# Patient Record
Sex: Female | Born: 1965 | Race: Black or African American | Hispanic: No | Marital: Married | Smoking: Never smoker
Health system: Southern US, Community
[De-identification: ages and names within clinical notes are randomized; demographics above are authoritative.]

## PROBLEM LIST (undated history)

## (undated) HISTORY — PX: ABDOMINAL HYSTERECTOMY: SHX81

---

## 1999-06-06 ENCOUNTER — Emergency Department (HOSPITAL_COMMUNITY): Admission: EM | Admit: 1999-06-06 | Discharge: 1999-06-07 | Payer: Self-pay | Admitting: Emergency Medicine

## 1999-11-18 ENCOUNTER — Encounter: Admission: RE | Admit: 1999-11-18 | Discharge: 1999-11-18 | Payer: Self-pay | Admitting: Internal Medicine

## 1999-11-23 ENCOUNTER — Encounter: Admission: RE | Admit: 1999-11-23 | Discharge: 1999-11-23 | Payer: Self-pay | Admitting: *Deleted

## 1999-11-23 ENCOUNTER — Encounter: Payer: Self-pay | Admitting: *Deleted

## 1999-12-01 ENCOUNTER — Encounter: Admission: RE | Admit: 1999-12-01 | Discharge: 1999-12-01 | Payer: Self-pay | Admitting: Hematology and Oncology

## 2000-02-10 ENCOUNTER — Encounter: Admission: RE | Admit: 2000-02-10 | Discharge: 2000-02-10 | Payer: Self-pay | Admitting: Internal Medicine

## 2000-02-28 ENCOUNTER — Encounter: Admission: RE | Admit: 2000-02-28 | Discharge: 2000-02-28 | Payer: Self-pay | Admitting: Obstetrics

## 2000-03-02 ENCOUNTER — Ambulatory Visit (HOSPITAL_COMMUNITY): Admission: RE | Admit: 2000-03-02 | Discharge: 2000-03-02 | Payer: Self-pay

## 2000-03-02 ENCOUNTER — Encounter: Admission: RE | Admit: 2000-03-02 | Discharge: 2000-03-02 | Payer: Self-pay | Admitting: Internal Medicine

## 2000-05-09 ENCOUNTER — Encounter (INDEPENDENT_AMBULATORY_CARE_PROVIDER_SITE_OTHER): Payer: Self-pay | Admitting: Specialist

## 2000-05-09 ENCOUNTER — Inpatient Hospital Stay (HOSPITAL_COMMUNITY): Admission: RE | Admit: 2000-05-09 | Discharge: 2000-05-11 | Payer: Self-pay | Admitting: Gynecology

## 2000-05-31 ENCOUNTER — Emergency Department (HOSPITAL_COMMUNITY): Admission: EM | Admit: 2000-05-31 | Discharge: 2000-05-31 | Payer: Self-pay | Admitting: *Deleted

## 2000-05-31 ENCOUNTER — Emergency Department (HOSPITAL_COMMUNITY): Admission: EM | Admit: 2000-05-31 | Discharge: 2000-06-01 | Payer: Self-pay | Admitting: Emergency Medicine

## 2000-06-01 ENCOUNTER — Inpatient Hospital Stay (HOSPITAL_COMMUNITY): Admission: EM | Admit: 2000-06-01 | Discharge: 2000-06-16 | Payer: Self-pay | Admitting: Gynecology

## 2000-06-01 ENCOUNTER — Encounter (INDEPENDENT_AMBULATORY_CARE_PROVIDER_SITE_OTHER): Payer: Self-pay

## 2000-06-01 ENCOUNTER — Encounter: Payer: Self-pay | Admitting: Emergency Medicine

## 2000-06-01 ENCOUNTER — Encounter (INDEPENDENT_AMBULATORY_CARE_PROVIDER_SITE_OTHER): Payer: Self-pay | Admitting: Specialist

## 2000-06-02 ENCOUNTER — Encounter: Payer: Self-pay | Admitting: Gynecology

## 2000-06-04 ENCOUNTER — Encounter: Payer: Self-pay | Admitting: Surgery

## 2000-06-05 ENCOUNTER — Encounter: Payer: Self-pay | Admitting: Surgery

## 2000-06-06 ENCOUNTER — Encounter: Payer: Self-pay | Admitting: Surgery

## 2000-06-07 ENCOUNTER — Encounter: Payer: Self-pay | Admitting: Gynecology

## 2000-06-08 ENCOUNTER — Encounter: Payer: Self-pay | Admitting: Internal Medicine

## 2000-06-10 ENCOUNTER — Encounter: Payer: Self-pay | Admitting: Internal Medicine

## 2000-06-16 ENCOUNTER — Encounter: Payer: Self-pay | Admitting: Surgery

## 2001-11-11 ENCOUNTER — Ambulatory Visit (HOSPITAL_COMMUNITY): Admission: RE | Admit: 2001-11-11 | Discharge: 2001-11-11 | Payer: Self-pay | Admitting: Obstetrics

## 2001-11-11 ENCOUNTER — Encounter: Payer: Self-pay | Admitting: Obstetrics

## 2002-07-21 ENCOUNTER — Emergency Department (HOSPITAL_COMMUNITY): Admission: EM | Admit: 2002-07-21 | Discharge: 2002-07-22 | Payer: Self-pay | Admitting: Emergency Medicine

## 2002-07-22 ENCOUNTER — Encounter: Payer: Self-pay | Admitting: Emergency Medicine

## 2004-09-28 ENCOUNTER — Ambulatory Visit (HOSPITAL_COMMUNITY): Admission: RE | Admit: 2004-09-28 | Discharge: 2004-09-28 | Payer: Self-pay | Admitting: Obstetrics

## 2006-02-23 ENCOUNTER — Emergency Department (HOSPITAL_COMMUNITY): Admission: EM | Admit: 2006-02-23 | Discharge: 2006-02-23 | Payer: Self-pay | Admitting: Family Medicine

## 2006-03-02 ENCOUNTER — Emergency Department (HOSPITAL_COMMUNITY): Admission: EM | Admit: 2006-03-02 | Discharge: 2006-03-02 | Payer: Self-pay | Admitting: Family Medicine

## 2007-11-22 ENCOUNTER — Emergency Department (HOSPITAL_COMMUNITY): Admission: EM | Admit: 2007-11-22 | Discharge: 2007-11-22 | Payer: Self-pay | Admitting: Emergency Medicine

## 2008-11-16 ENCOUNTER — Emergency Department (HOSPITAL_COMMUNITY): Admission: EM | Admit: 2008-11-16 | Discharge: 2008-11-16 | Payer: Self-pay | Admitting: Family Medicine

## 2009-07-30 ENCOUNTER — Ambulatory Visit (HOSPITAL_COMMUNITY): Admission: RE | Admit: 2009-07-30 | Discharge: 2009-07-30 | Payer: Self-pay | Admitting: Family Medicine

## 2009-08-12 ENCOUNTER — Encounter: Admission: RE | Admit: 2009-08-12 | Discharge: 2009-08-12 | Payer: Self-pay | Admitting: Family Medicine

## 2011-01-06 NOTE — H&P (Signed)
[facility]  Patient:    Casey Wright, Casey Wright          MRN: [id number] Adm. Date:  [id number] Attending:  Katrina Stack                         History and Physical  CHIEF COMPLAINT:  Nausea and abdominal pain.  HISTORY OF PRESENT ILLNESS:  Casey Wright is a 45 year old white nulligravida who, on May 09, 2000, had exploratory laparotomy, lysis of extensive pelvic adhesions, myomectomy of an enormous posterior wall myoma with reconstruction of her uterus, lysis of extreme pelvic adhesions with excision of endometriosis, excision of bilateral ovarian endometriomas and finally right salpingo-oophorectomy with reconstruction of the left tube and ovary. Postoperatively, she did well and was discharged home.  She was seen frequently in the office postoperatively and had no difficulties until approximately three weeks postoperatively and she began to have abdominal pain.  She was seen in the office and examined and was without evidence of fever, direct or rebound tenderness.  She was given Ultram and asked to return for further followup if her symptoms did not improve.  She presented to the emergency room rather than the office and was examined there on two occasions. She complained of not having had a bowel movement for approximately 48 hours and was given magnesium citrate.  After taking magnesium citrate, she became acutely more nauseated and experienced vomiting and increasingly severe abdominal pain.  She was seen again at the office on June 01, 2000 and noted to have very diminished bowel sounds, a distended abdomen and direct abdominal tenderness.  Examination also revealed a foul-smelling vaginal discharge.  She related a history of onset of menstrual period approximately three days prior to recurrence to the onset of her abdominal pain with progressive increase in discomfort.  She is now readmitted with suspected endomyometritis and  possible recurrence of tuboovarian complex.  PAST MEDICAL HISTORY AND REVIEW OF SYSTEMS:  As recorded in the previous history and physical examination.  PHYSICAL EXAMINATION  GENERAL:  Well-developed, thin, acutely ill black female.  HEENT:  Pupils equal, react to light.  Fundi not examined.  Oropharynx clear with ketotic breath.  NECK:  Supple without mass or thyroid enlargement.  CHEST:  Clear P to A.  HEART:  Regular rhythm, without murmur or cardiac enlargement.  ABDOMEN:  Distended with well-healed vertical midline skin incision from recent surgery, as above.  Examination of her abdomen revealed diminished breath sounds with direct tenderness in all quadrants and no significant rebound.  No masses.  PELVIC:  External genitalia:  Normal female.  Vagina:  Foul-smelling, bloody mucoid vaginal discharge.  Cervix is open and moderately tender to examination.  The uterus was noted to be approximately 10-weeks-size.  No other masses could be palpated but her ability to cooperate was quite limited. Ultrasound examination revealed a long uterine cavity with a broadened endometrial stripe, no significant fluid in the cavity.  There was a large pool of peritoneal fluid in the cul-de-sac.  Aspiration of the cul-de-sac fluid was done after adequate preparation of the vagina and cul-de-sac under ultrasound directions, submitted for cytologic exam, Gram stain and for cultures.  The fluid was yellowish-amber color, slightly turbid.  Gram stain revealed no evidence of bacteria.  Cultures are submitted, aerobic only.  EXTREMITIES:  Negative.  No evidence of phlebitis.  NEUROLOGIC:  Physiologic.  IMPRESSIONS 1. 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 2. Ileus versus possible  small-bowel obstruction.  NOTE:  She had recent cultures for GC and Chlamydia which were negative preoperatively. DD:  06/04/00 TD:  06/05/00 Job: [id number]

## 2011-01-06 NOTE — Op Note (Signed)
[facility]  Patient:    Casey Wright, Casey Wright                     MRN: [id number] Attending:  Gretta Cool, M.D.                           Operative Report  PREOPERATIVE DIAGNOSES 1. Enormous uterine leiomyomata, posterior wall of the uterus, with abnormal    bleeding. 2. Bilateral solid ovarian masses suspicious of endometriomas. 3. Incapacitating cyclic pelvic pain, probably secondary to endometriosis. 4. CA125 elevation to 203.  POSTOPERATIVE DIAGNOSES 1. Bilateral ovarian endometriomas. 2. Severe pelvic inflammatory disease with extensive adhesion in bilateral    tuboovarian complexes. 3. Large posterior leiomyoma with abnormal uterine bleeding.  PROCEDURE:  Exploratory laparotomy, lysis of extensive pelvic adhesions, myomectomy with reconstruction of the uterus, lysis of extreme pelvic adhesions, excision of endometriosis, excision of bilateral endometriomas, right salpingo-oophorectomy, uterine suspension to the right round ligament.  SURGEON:  Gretta Cool, M.D.  ASSISTANT:  Raynald Kemp, M.D.  ANESTHESIA:  General endotracheal.  DESCRIPTION OF PROCEDURE:  Under excellent general anesthesia, with the patients abdomen prepped and draped into a sterile field, in Allen stirrups, a vertical skin incision was made from the umbilicus to the pubic symphysis. The fascia was then opened and the peritoneum incised.  The abdomen was then explored.  There were no abnormalities in the upper abdomen.  Examination of the pelvis revealed an enormous leiomyoma protruding from the posterior wall of the uterus; there were also bilateral endometriomas of both ovaries, the left approximately 7 cm, the right 5 cm.  Extensive pelvic inflammatory disease was also present with bilateral tuboovarian complexes.  There was severe adhesion of the right ovary to the posterior aspect of the uterus and to the lateral pelvic wall.  Because of the adjacent masses,  visualization was difficult throughout the procedure.  The uterine fibroid was managed by incision of the posterior wall of the uterus in an elliptical incision.  The myoma was then dissected by blunt and sharp dissection until the entire fibroid had been removed.  It extended all the way to the endometrial cavity, with evidence of great distention of the endometrial cavity.  There was no palpable mass in the cavity other than this fibroid.  Once the fibroid was excised and bleeding points ligated or cauterized along the way, the uterus was reconstructed in layers, using 0 Vicryl for the first two layers of muscle closure.  Once bleeding was well-controlled, the serosa was trimmed and a subcuticular closure used to close the serosa of the posterior aspect of the uterus to prevent adhesion formation.  Once the uterus was repaired, attention was turned first to the left ovary.  It was elevated from the pelvic by blunt and sharp dissection.  The fallopian tube was dissected off of the ovary at the fimbriated end.  The ovary was then opened and the enormous endometrioma evacuated.  Next, the endometrioma cyst lining was stripped and the entire endometriosis removed from the ovary.  Note:  There was virtually no surface disease of endometriosis present.  The ovary was then reconstructed with a deep suture and then subcuticular closure of 4-0 PDS.  A second endometrioma was then identified on the right ovary and it was also incised, the cyst wall evacuated and then it was closed in layers with subcuticular closure as well. Examination of the right ovary again confirmed a very  large endometrioma and severe adhesion to the posterior aspect of the uterus and to the lateral pelvic wall.  Because of the hydrosalpinx present and the severe adhesion of the ovary, with great difficulty in mobilizing it from the pelvis, decision was made to proceed with right salpingo-oophorectomy.   Infundibulopelvic vessels were skeletonized, clamped, transected and then doubly ligated with 0 Vicryl.  The ovarian ligaments were then skeletonized, clamped, cut, sutured and tied with 0 Vicryl as well.  The specimen was submitted for pathologic exam.  At this point, the right round ligament was used to elevate the uterus and pull the posterior incision away from the left ovary.  The round ligament was pulled back over the infundibulopelvic and ovarian defect so as to rotate the uterus away from the ovary and elevate the uterus into higher intra-abdominal position rather than returning it to the pelvis.  At this point, all of the pelvic surfaces were examined; there was no evidence of significant bleeding remaining.  Peritoneal bleeding points and all bleeding points that were identified were treated by cautery or individual ligation. The ______ was then returned to the cul-de-sac and the omentum pulled down over the pelvic structures so as to help prevent adhesion formation.  The abdominal peritoneum was then closed with running suture of 0 Monocryl.  The fascia was then approximated with a running suture of 0 Vicryl, far and near, from each angle to the midline.  Subcutaneous tissue was approximated with interrupted sutures of 3-0 Vicryl and the skin was closed with skin staples and Steri-Strips.  At the end of the procedure, sponge and lap counts were correct.  There were no complications.  Patient returned to the recovery room in excellent condition.DD:  05/09/00 TD:  05/09/00 Job: [id number]

## 2011-01-06 NOTE — H&P (Signed)
[facility]  Patient:    Casey Wright, Casey Wright                     MRN: [id number] Adm. Date:  05/09/00 Attending:  Gretta Cool, M.D.                         History and Physical  CHIEF COMPLAINT:  Pelvic pain.  HISTORY OF PRESENT ILLNESS:  Ms. Schoff is a 45 year old nulligravida, recently married to a man 11 years her senior, complaining of anemia, painful intercourse and bulge of her abdomen.  She states over the last several months, she has begun to be uncomfortable, sufficient to prevent penetration with intercourse; she has also developed a bulge above her symphysis, extending nearly to the umbilicus.  She has had diminished bladder capacity; has difficulty making it through the night without getting up to void several times.  She is also having extremely heavy flow for the first two days of her cycle, with five to seven days total flow.  She has developed significant anemia.  She has continued to have regular cyclic menses but is concerned because of her difficulty with discomfort and with achieving intercourse.  She has a history of Chlamydia PID.  She has never used effective contraception and has not conceived.  On ultrasound examination, she was found to have an enormous abdominopelvic mass with uterine leiomyoma identified measuring 8.1 x 7.2 cm in maximal dimension; she also was noted to have bilateral ovarian masses, solid in appearance, with homogeneous appearances characteristic of endometriomas, bilaterally.  Her left ovary measured 7.6 x 6.4 x 7.1 cm; the right ovary measured 7.0 x 4.6 x 5.4 cm.  She had CA125 drawn because of suspicion of endometriosis versus solid ovarian tumor; CA125 was 203.  Because of the concern over the possibility of solid ovarian tumors, she is scheduled with backup of Dr. Reuel Boom L. Clarke-Pearson, GYN-Oncology, [employer], in case there is more than benign disease.  She is now admitted for definitive  therapy, with hopes of preserving her fertility, for myomectomy and for treatment of probable benign endometriomas of both ovaries and probable sequelae of previously known pelvic inflammatory disease.  She strongly wishes to preserve fertility.  She understands the possibility of need for total hysterectomy and salpingo-oophorectomy.  PAST MEDICAL HISTORY:  Usual childhood diseases without sequelae.  Denies previous hospitalizations.  ALLERGIES:  None known.  PRESENT MEDICATIONS:  None.  FAMILY HISTORY:  Father is living and well.  Mother is living with type 2 diabetes.  Two brothers and two sisters alive and well.  SOCIAL HISTORY:  Recently married to a man 11 years her senior.  REVIEW OF SYSTEMS:  HEENT:  Denies symptoms.  CARDIORESPIRATORY:  Denies asthma, cough, bronchitis, shortness of breath.  GI/GU:  Denies frequency, urgency or dysuria.  She does have nocturia and diminished bladder capacity.  PHYSICAL EXAMINATION  GENERAL:  A well-developed, thin black female.  HEENT:  PERLA.  Fundi not examined.  Oropharynx clear.  NECK:  Supple without mass or thyroid enlargement.  CHEST:  Clear P-to-A.  HEART:  Regular rhythm.  Without murmur or cardiac enlargement.  BREASTS:  Soft, without mass, nodes or nipple discharge.  ABDOMEN:  A large protuberant mass is noted arising from beneath the symphysis to just below the umbilicus.  The mass is firm and tender.  There is no other organomegaly.  PELVIC:  External genitalia:  Normal female.  Vagina clean,  rugose.  The vagina can only be penetrated for 4 or 5 cm before encountering a tender pelvic mass.  There are bilateral soft masses adjacent to the cervix that are compatible with the ultrasound findings of bilateral endometriomas.  There is a large uterine mass approximately 16-weeks-size arising nearly to the umbilicus and fills the pelvis, assumed to be a posterior myoma, compatible with the ultrasound  findings.  EXTREMITIES:  Negative.  NEUROLOGIC:  Physiologic.  IMPRESSIONS 1. Uterine leiomyoma, largely one solitary myoma, posterior wall of the    uterus, with abnormal uterine bleeding and severe anemia. 2. Bilateral ovarian masses, most likely endometriomas; rule out solid ovarian    tumors. 3. History of pelvic inflammatory disease with Chlamydia.  DISPOSITION AND PLAN:  Exploratory laparotomy and definitive therapy as indicated, with the goal of preserving her fertility if possible and correcting the underlying defects.  She understands the possibility of other than benign disease and possible definitive therapy including total abdominal hysterectomy, salpingo-oophorectomy, etc.  She understands options and alternatives and the possibility of subsequent need for further surgery if her pelvic organs are retained with these findings. DD:  05/10/00 TD:  05/11/00 Job: [id number]

## 2011-01-06 NOTE — Op Note (Signed)
[facility]  Patient:    Casey Wright, Casey Wright          MRN: [id number] Proc. Date: 06/05/00 Adm. Date:  [id number] Attending:  Katrina Stack                           Operative Report  PREOPERATIVE DIAGNOSIS:  Small bowel obstruction.  POSTOPERATIVE DIAGNOSIS:  Small bowel obstruction.  PROCEDURE:  Exploratory laparotomy, lysis of adhesions.  SURGEON:  Abigail Miyamoto, M.D.  ASSISTANT:  Gretta Cool, M.D.  ANESTHESIA:  General endotracheal anesthesia.  ESTIMATED BLOOD LOSS:  Minimal.  INDICATIONS:  The patient is a 45 year old female who had undergone exploration and removal of right ovary and tube for severe PID, tubo-ovarian abscess, and removal of myomas of the uterus.  She now presents with a large amount of fluid in the pelvis and dilated loops of bowel consistent with either an ileus or a bowel obstruction.  FINDINGS:  The patient was found to have small bowel obstruction with loops of bowel tethered to the posterior aspect of the uterus.  Once the bowel was freed up, the obstruction appeared to open up and no resection was required.  DESCRIPTION OF PROCEDURE:  The patient was brought to the operating room and identified as [employer].  She was placed upon the operating table and anesthesia was induced.  Her abdomen was then prepped and draped in the usual sterile fashion.  Her previous midline incision was then opened with a scalpel.  The incision was carried down through the fascia with the cautery. The peritoneum was then identified and opened the entire length of the incision.  Her incision was just off to the left side of the midline.  Upon entering the abdomen, a large amount of clear ascitic-appearing fluid was identified and suctioned.  The patient was found to have multiple dilated loops of small bowel with collapse of the distal bowel.  She was also found to have multiple loops tethered to the posterior  aspect of the uterus and to the sigmoid colon.  The adhesions were taken down with Metzenbaum scissors.  Once all adhesions were taken down and the bowel was freed off the uterus, it was examined.  No enterotomies were identified.  Some areas of serosal tearing were closed with interrupted 3-0 silk sutures.  The bowel was then run from the terminal ileum to the ligament of Treitz, and again no other injury was identified.  The bowel contents were milked back into the stomach and sucked out of the NG tube.  Next, Dr. Nicholas Lose will dictate his part of the hysterectomy.  Once this was done and the abdomen irrigated, the peritoneum was closed with a running 0 Monocryl suture.  The fascia was then closed with a running #1 Novofil suture.  Dr. Nicholas Lose will dictate also the rest of the closure. DD:  06/05/00 TD:  06/06/00 Job: [id number]

## 2011-01-06 NOTE — Discharge Summary (Signed)
[facility]  Patient:    Casey Wright, Casey Wright          MRN: [id number] Adm. Date:  [id number] Disc. Date: [id number] Attending:  Katrina Stack CC:         Gretta Cool, M.D. (2)   Discharge Summary  HISTORY OF PRESENT ILLNESS:  This is a 45 year old black married nulligravida, status post exploratory laparotomy, lysis of extensive pelvic adhesions, myomectomy of enormous posterior wall myoma, with reconstruction of uterus, lysis of extreme pelvic adhesions, and excision of endometriosis, excision of bilateral ovarian endometriomas, and finally right salpingo-oophorectomy, with reconstruction of he left tube and ovary on May 09, 2000.  Postoperatively she did well and was discharged home on the second postoperative day.  She was seen frequently in the office and did well postoperatively until approximately three weeks after surgery. She then began to have abdominal pain.  She was seen and examined in the office, without evidence of fever, direct, or rebound tenderness.  She was given Ultram, and asked to return for followup.  She presented to the emergency room rather than to my office, and was examined there and treated on two occasions.  She complained of not having had a bowel movement for 48 hours prior to admission to the emergency room, and was given magnesium citrate.  She became acutely more nauseated and experienced vomiting, increasingly severe abdominal pain, and was again seen in the office on June 01, 2000.  She was noted to have diminished bowel sounds and  distended abdomen with direct abdominal tenderness.   A pelvic examination also  revealed a foul-smelling vaginal discharge.  She related a history of onset of er pain and discomfort to approximately three days after the onset of menses.  Her  abdominal pain became progressively worse, and her nausea and vomiting also worsened.  She is now admitted  with a diagnosis of endometritis with probable recurrence of tubo-ovarian complex and pelvic inflammatory disease, with concomitant severe ileus, rule out small bowel obstruction.  PAST MEDICAL HISTORY/REVIEW OF SYSTEMS:  See the recent admission history and physical.  PHYSICAL EXAMINATION:  GENERAL:  A generally well-developed, but acutely ill black female.  General examination is entirely unremarkable with the exception of that related to her abdomen and pelvis.  ABDOMEN:  Distended.  There is a well-healed vertical midline incision from her  previous recent surgery.  On examination of her abdomen she had diminished bowel sounds.  Direct tenderness in all quadrants.  No significant rebound.  PELVIC:  Her external genitalia normal.  Vagina was filled with a foul-smelling  bloody mucoid discharge.  Cervix is open, moderately tender to examination. Uterus is noted to be approximately 10 weeks size.  There are no other masses palpable, but the examination is exceedingly limited, because of her guarding and tenderness, and intolerance to examination.  On ultrasound examination in a longitudinal view of the endometrial cavity, there is a broadened endometrial stripe.  No significant amount of fluid in the cavity. There is a large pool of peritoneal fluid present in the cul-de-sac. Aspiration of the cul-de-sac was done after adequate preparation of the vagina, and the fluid  submitted for cytologic examination, Grams stain, and cultures.  The fluid was yellowish, amber, slightly turbid.  Grams stain revealed no bacteria.  Cultures  were submitted.  IMPRESSION ON ADMISSION: 1. 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    Probable progressive endometritis following onset of menses. 2. Ileus, versus  possible small bowel obstruction.  LABORATORY DATA:  Revealed a white count of 9100, hemoglobin 8.6, hematocrit 27.6. Normal differential.  There was an increase in the number of bands, toxic granulation, vacuolated neutrophils, polychromasia, schistocytes.  Her sedimentation rate was elevated at 48.  Her electrolytes remained reasonably normal with a potassium of 3.6, sodium 137.   HOSPITAL COURSE:  She was initially treated conservatively for ileus, versus small bowel obstruction, with antibiotics and IV hydration.  She reported a spontaneous bowel movement while in x-ray, for a flat and upright of her abdomen studies. n June 02, 2000, she reported several more bowel movements, and passage of flatus. X-rays confirmed a mild ileus.  It was anticipated that she may be beginning to  resolve the endometritis and ileus on antibiotic therapy and conservative management.  Further conservative management failed to improve her condition, however, and a nasogastric tube was placed.  Again she failed to respond to this conservative management.  A surgical consultation was requested.  They also had the same impression of ileus, versus small bowel obstruction.  She was hypokalemic nd the potassium and electrolyte balance corrected vigorously.  On June 05, 2000, she experienced a temperature spike to 101 degrees.  She continued to have an extremely distended abdomen, considered to be becoming even more distended, in spite of nasogastric suction and antibiotic therapy.  Her care was reviewed with the general surgeons on June 05, 2000, because of our concerns of her lack of response to IV antibiotic therapy and conservative management of an ileus, versus small bowel obstruction, the mounting risk of complications such s adult RDS, thromboembolic complications, etc.  A decision was made to proceed to re-exploration and definitive therapy as indicated.  On re-exploration, she  was noted to have a large volume of peritoneal fluid that had previously been noted on x-ray.  Her bowel was grossly distended.  She had  small bowel loops present in the pelvis, adherent to the posterior aspect of the uterus, and to the ovarian repair of the left ovary and fallopian tube.  The fallopian tube and ovary had again become a tubo-ovarian complex, with extensive adhesion to pelvic structures, and to bowel.  The bowel was removed from the pelvis and repaired by the attending general surgeon, Dr. Abigail Miyamoto.  Because of the evidence of extreme pelvic inflammatory response once again, and because of now virtual complete destruction of her right ovary and fallopian tube with the inflammatory process, and also because of the extremely poor probability of successful pregnancy, it was the opinion of the operators, Dr. Gretta Cool and Dr. Raynald Kemp, that she had virtually no change of a successful pregnancy, nd that she had a very high chance of recurrence of bowel adhesion and reobstruction or continuing infection that may threaten her life.  The decision was therefore  made to proceed to a supracervical hysterectomy, and to remove the source of possible continuing infection and reinfection with each menstrual period.  At this point she underwent a supracervical hysterectomy.  She was transfused intraoperatively.  She also had a great excess of IV fluid administered intraoperatively over her output.  She continued to have a marked tachycardia on June 06, 2000, and was transfused again to a more physiologic hemoglobin and  hematocrit.  Her hemoglobin stabilized at 10.1.  A PIC line was placed by the general surgeons and x-ray personnel  for TPN.  Her electrolyte abnormalities corrected, and nutritional therapy was begun.  It had been more than one week since she had had effective nutrition.  Shortly after the [facility] line was placed, and TPN begun, the patient  had an episode of desaturation from the 90s to 76 PO2.  She ad subsequently undergone a workup for a suspected pulmonary embolus with a V/Q scan. She also had a venous Doppler study of her PIC site.  She was initially anticoagulated, but because of a concern for acute bleeding because of the recent surgery, and with no evidence of thromboembolic disease by any of the studies, he decision was made to discontinue anticoagulant therapy.  She was temporarily kept on subanticoagulating doses, but that was also eventually discontinued. Because of a depressed mental state and difficulty with cooperation with the nurses, and a  psychosis even with visual hallucinations in the step-down unit, medication was  given to improve her mental status.  She was given Ativan initially with effective sleep.  She resolved the psychosis portion.  She remained depressed, and subsequently was begun on Zoloft.  She continued to have an extremely marked tachycardia on June 09, 2000.  Her  hemoglobin remained quite low because of the persistent tachycardia and concern  over cardiovascular decompensation.  She was transfused 3 units of packed cells, to a more physiologic hemoglobin again.  She responded with improvement in her tachycardia.  She continued to have a low-grade fever that gradually decreased o normal over the next 48 hours.  She remained very severely depressed and had difficulty cooperating with the nursing staff and remained very discouraged about slow progress of decompression of her abdomen and return to normal bowel function. Remained concerned about eating, because of the possibility of recurrence of pain and possible reobstruction.  She also complained of hot flushes, and her initial estrogen patch was increased from 0.05 to 0.1 mg dose.  By June 13, 2000, her TPN was being weaned, and she was encouraged to take small amounts of liquids. She remained concerned about eating  and possible redistended and bowel complications. Eventually she agreed to take liquid nutrition such as Resource, milk shakes, etc. Her diet was eventually advanced.  On June 15, 2000, she was passing flatus but remained uncooperative with oral intake.  Her PIC line remained in place for delivery of IV fluids.  With the help of pastoral care, and with antidepressant therapy, she subsequently agreed to progress her nutrition and diet.  DISPOSITION: Her PIC line was removed, and she discharged home on June 16, 2000, to continue to progress her diet.  DISCHARGE MEDICATIONS: 1. Zoloft 25 mg, increasing to 50 mg q.d. 2. Xanax 0.5 mg 1/2 tablet to one tablet b.i.d. for anxiety. 3. Citrucel tablet t.i.d. 4. Darvocet-N 100 mg p.r.n. pain. 5. Multivitamins q.d. 6. Estradiol 17 beta patch 0.1 mg dose.  INSTRUCTIONS:  I have continued to explain the need for adequate ambulation to elp prevent adhesion formation and for careful daily management of her bowel movements, and daily use of Metamucil to help prevent reobstruction and the risk of a partial small bowel obstruction recurring.  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  PROCEDURE: 1. Exploratory laparotomy, supracervical hysterectomy, left salpingo-oophorectomy,    release of small bowel adhesions from the pelvis. 2. Peripherally inserted catheter line placement. 3. Peripheral nutrition (TNA).  OTHER SIGNIFICANT EVENTS:  Suspected pulmonary embolus, with no evidence of embolus on the V/Q scan  and Doppler studies.  CONSULTATION:  General surgery and pharmacy for TNA. DD:  07/10/00 TD:  07/10/00 Job: [id number]

## 2011-01-06 NOTE — Discharge Summary (Signed)
[facility]  Patient:    Casey Wright, Casey Wright          MRN: [id number] Adm. Date:  [id number] Disc. Date: [id number] Attending:  Katrina Stack                           Discharge Summary  CHIEF COMPLAINT:  Pelvic pain.  BRIEF HISTORY:  Ms. Baity is a 45 year old black recently married, nulligravida, married to a man 11 years her senior.  She complains of painful intercourse and a bulge of her abdomen.  She states over the last several months she has begun to be uncomfortable sufficiently to prevent penetration with intercourse.  She has also developed a huge bulge in her abdomen and above the symphysis extending nearly to the umbilicus.  She has noticed diminished bladder capacity, difficulty making it through the night without up to void several times.  She is also having extremely heavy menstrual flow for the 2 days of each cycle, cycles now lasting 5-7 days total.  She has developed significant anemia.  She is now concerned because of the pain and anemia.  She has a previous history of pelvic inflammatory disease with chlamydia.  She has never used effective contraception and has not conceived even though she has had multiple partners.  On ultrasound examination in the office she was found to have an enormous abdominal pelvic mass with uterine leiomyomata identified measuring 8.1 x 7.2 cm in maximum dimension.  She is also noted to have bilateral ovarian masses, solid in appearance with homogenous characteristics suggesting endometriomas bilaterally.  Her left ovary measured 7.6 x 6.4 x 7.1.  The right ovary measured 7.0 x 4.6 x 5.4.  She had CA-125 drawn for preoperative evaluation because of suspicion of endometriosis versus solid ovarian tumor.  CA-125 was 203.  Because of the possibility of solid ovarian tumor Dr. De Blanch was scheduled to stand by the primary surgery.  She was admitted for definitive therapy in hopes of  preserving her fertility, for myomectomy and for treatment of her bilateral pelvic masses assumed to be endometriosis.  She also understands the possibility that she may require hysterectomy if this is other than benign disease or nontreatable by conservative surgery.  PAST MEDICAL HISTORY AND REVIEW OF SYSTEMS:  Noncontributory.  PHYSICAL EXAMINATION:  Largely as described above.  GENERAL:  Her general exam is unremarkable.  Exam related to her abdomen and pelvis reveals a 16 cm uterine fundus rising to nearly the umbilicus.  The pelvis is full of mass including the fibroid and the abdominal pelvic mass. On pelvic examination she has exquisite tenderness with bilateral adnexal masses palpable and large posterior uterine leiomyoma.  IMPRESSION: 1. Uterine leiomyomata, largely solitary, posterior wall with abnormal uterine    bleeding and severe anemia. 2. Bilateral ovarian masses most likely endometriomas, rule out ovarian    neoplasm, solid. 3. 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  On admission the plan was exploratory laparotomy and definitive therapy as indicated by the pathology found.  As surgery she underwent exploratory laparotomy, lysis of extensive pelvic adhesions, myomectomy with reconstruction of the uterus, right salpingo-oophorectomy for unreconstructable severe pelvic inflammatory process and severe endometriosis, left ovarian resection of endometrioma with repair of the ovary and fallopian tube.  Note, she had bilateral tube ovarian complexes.  The right was so scarred that it was felt not compatible with reconstruction.  The left was less  involved and the ovarian surface was not extensively damaged and it was elected to preserve the left ovary after myomectomy and reconstruction of the left fallopian tube and relief of the hydrosalpinx.  Postoperatively the patient was very difficult to mobilize.  She  remained afebrile and was taking regular diet and finally discharged home on May 11, 2000 after passing bowel movement and flatus.  She is to return to the office in 1 week, report a temperature of greater than 100.5, report failure of daily improvement, Vioxx 25 mg daily for pain, ambulate every 2-3 hours to help prevent adhesion reformation.  She understands the importance of ambulation to prevent adhesion reformation and to allow maximum benefit of the surgery that has been done.  Her postoperative hemoglobin was 7.4, and hematocrit 23.5, preoperative 10.8, and 33.3. DD:  07/10/00 TD:  07/10/00 Job: [id number]

## 2011-01-06 NOTE — Op Note (Signed)
[facility]  Patient:    Casey Wright, Casey Wright          MRN: [id number] Proc. Date: 06/05/00 Adm. Date:  [id number] Attending:  Katrina Stack CC:         Abigail Miyamoto, M.D.   Operative Report  PREOPERATIVE DIAGNOSIS:  Endomyometritis with probable tubo-ovarian complex recurrent to ileus versus small bowel obstruction.  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  PROCEDURES: 1. Exploratory laparotomy. 2. Decompression of bowel. 3. Lysis of small and large bowel adhesions. 4. Lysis of extensive pelvic adhesions. 5. Left salpingo-oophorectomy and abdominal hysterectomy.  SURGEON:  Gretta Cool, M.D.  ASSISTANT:  Raynald Kemp, M.D. and general surgeon Abigail Miyamoto, M.D. for the general surgical portion of the procedure.  His operative noted will be dictated separately.  DESCRIPTION OF PROCEDURE:  Under excellent general anesthesia with the patient prepped and draped in the lithotomy position, a vertical skin incision was made from the umbilicus to the pubic symphysis at the site of her previous incision.  The incision was then subsequently extended beyond the umbilicus to allow adequate room and exposure.  Dr. Magnus Ivan released the small bowel adhesions, several loops from the pelvis adherent to the posterior aspect of the uterus and to the left ovary.  Serosal defects were repaired by over sewing the defects by Dr. Magnus Ivan.  The small bowel was then decompressed. At her previous surgery, the patient had right salpingo-oophorectomy for bilateral tubo-ovarian complexes.  She had repair and fimbriolysis of the hydrosalpinx on the left and excision of an enormous endometrioma.  She also had excision of an enormous posterior wall myoma occupying virtually the posterior half of the uterus.  There was adhesion of small bowel to  the structures noted above and adhesion of the large bowel to the posterior aspect of the uterus.  Once the bowel was decompressed by evacuation through nasogastric tube, the bowel appeared viable, healthy and without the need for resection in the opinion of Dr. Magnus Ivan.  Attention was then turned to the removal of the inflamed organs.  The infundibulopelvic vessels were isolated from the ureter, clamped, cut, sutured and tied with 0 Vicryl.  The round ligament was then also clamped, cut, sutured and tied bilateral with 0 Vicryl. The anterior leaf of the broad ligament was then opened and the bladder pushed off of the lower segment.  The uterine vessels were then skeletonized, clamped, cut, sutured and tied with 0 Vicryl.  The cervix was then incised and a supracervical hysterectomy contemplated.  The cervix was wedged until the entire endocervical canal was delivered by traction and the entire cervix eventually removed.  The thick paracervical fascia and vaginal cuff was then sutured with a running suture of 0 Vicryl from each angle to the midline.  A second suture was used to invert the initial suture line with with 2-0 Vicryl. At this point, all bleeding was well controlled.  Other bleeding sites were controlled by monopolar cautery.  The bowel was again examined and there was no evidence of compromise of the bowel integrity or vascularity.  At this point, the abdominal peritoneum was closed with a running suture of 0 Vicryl from the apex to the symphysis.  The abdominal wall was then closed by Dr. Magnus Ivan with a running Smead-Jones type closure of 0 Novofil.  The subcutaneous tissue and skin was then closed with interrupted mattress sutures of 3-0 nylon.  Steri-Strips were  then applied and the patient returned to the recovery room in excellent condition.  She tolerated the procedure well. Preoperatively, she was transfused one unit.  Intraoperatively, she was transfused a second. DD:   06/05/00 TD:  06/06/00 Job: [id number]

## 2011-01-06 NOTE — Consult Note (Signed)
[facility]  Patient:    Casey Wright, Casey Wright          MRN: [id number] Proc. Date: 06/04/00 Adm. Date:  [id number] Attending:  Katrina Stack CC:         Gretta Cool, M.D.   Consultation Report  ACCOUNT NUMBER:  [id number].  REASON FOR CONSULTATION:  Possible bowel obstruction versus postoperative ileus.  CLINICAL HISTORY:  I was asked today to see Casey Wright for a possible ileus versus postoperative bowel obstruction.  She is a 45 year old lady who underwent removal of enormous uterine leiomyomata with bilateral solid ovarian masses suspicious of endometriomas on approximately May 09, 2000. Patient tolerated the procedure well but had a fairly slow postoperative course.  She was discharged on May 11, 2000 but continues to have problems with abdominal discomfort and nausea and had to be readmitted on May 01, 2000 with increased abdominal discomfort and pain.  On admission, she was found to have what looked like an ileus pattern on her x-rays and was placed on IV fluids and then NG suction, but has not really progressed.  She continues to complaint today of abdominal distention, which is no better with her NG tube, inability to have bowel movements and diffuse non-cramping abdominal pain.  Patients past history is significant in that she has had the usual childhood diseases, no known allergies and had been on no medications until this surgery and had a negative review of systems except for her GI tract.  Her initial exam at time of surgery was noted for protuberant mass from the symphysis which was consistent with her 16-week- to 20-week-size endometrioma.  The patient was admitted here, as noted above, and was noted also to have some free fluid in the cul-de-sac that was straw-colored and apparently also had a gram-negative fecal vaginal discharge.  She was thought also to possibly have endometritis, post multiple  myomectomies, and has been placed on antibiotics for that.  Since May 31, 2000, she had several bowel movements noted on June 02, 2000, was passing flatus, x-ray was confirmed showing a mild ileus; however, because continued abdominal discomfort, NG was placed and she had begun vomiting.  That was finally placed on June 03, 2000.  Laboratory studies on June 03, 2000 apparently were thought to show a potassium of 3.9, but the laboratory study actually shows a potassium of 2.5.  She has not really responded to enemas and had not gotten much benefit from the NG tube.  EXAMINATION  GENERAL:  The patient is sitting in a chair, alert, oriented, but appears uncomfortable, with somewhat of a flat affect and appears discouraged by her illness.  HEENT:  Head is normocephalic.  Eyes are nonicteric.  Pupils are round and regular.  NECK:  Supple.  No masses or thyromegaly.  LUNGS:  Clear to auscultation.  HEART:  Regular.  No murmurs, rubs, or gallops are heard.  ABDOMEN:  The abdomen is distended, with a well-healed lower midline scar.  It is soft.  There is some diffuse mild tenderness.  Bowel sounds are hypoactive and I heard virtually no bowel sounds.  PELVIC AND RECTAL:  I did not do a rectal exam or pelvic exam.  EXTREMITIES:  No cyanosis or edema.  IMPRESSION 1. Ileus versus small-bowel obstruction. 2. Hypokalemia. 3. Possible endometritis.  PLAN:  I am going to request some current laboratory studies today to assess her potassium level, electrolytes, see what her nutritional is and some repeat x-rays to see if we  have had any worsening of the pattern to suggest small-bowel obstruction, or whether anything that would make [place] more clearly a ileus pattern.  Will also follow back up with a repeat exam in a couple of hours to see when these laboratory studies are back if that will help [place] out any with that is going on. DD:  06/04/00 TD:  06/04/00 Job:  [id number]

## 2011-03-07 ENCOUNTER — Emergency Department (HOSPITAL_COMMUNITY)
Admission: EM | Admit: 2011-03-07 | Discharge: 2011-03-07 | Disposition: A | Discharge status: Home / Self Care | Payer: PRIVATE HEALTH INSURANCE | Attending: Emergency Medicine | Admitting: Emergency Medicine

## 2011-03-07 DIAGNOSIS — R5383 Other fatigue: Secondary | ICD-10-CM | POA: Insufficient documentation

## 2011-03-07 DIAGNOSIS — R112 Nausea with vomiting, unspecified: Secondary | ICD-10-CM | POA: Insufficient documentation

## 2011-03-07 DIAGNOSIS — T50905A Adverse effect of unspecified drugs, medicaments and biological substances, initial encounter: Secondary | ICD-10-CM | POA: Insufficient documentation

## 2011-03-07 DIAGNOSIS — R5381 Other malaise: Secondary | ICD-10-CM | POA: Insufficient documentation

## 2011-03-07 DIAGNOSIS — R51 Headache: Secondary | ICD-10-CM | POA: Insufficient documentation

## 2011-03-07 LAB — POCT I-STAT, CHEM 8
Calcium, Ion: 1.09 mmol/L — ABNORMAL LOW (ref 1.12–1.32)
HCT: 42 % (ref 36.0–46.0)
TCO2: 27 mmol/L (ref 0–100)

## 2011-03-07 LAB — URINE MICROSCOPIC-ADD ON

## 2011-03-07 LAB — URINALYSIS, ROUTINE W REFLEX MICROSCOPIC
Glucose, UA: NEGATIVE mg/dL
Hgb urine dipstick: NEGATIVE
Protein, ur: NEGATIVE mg/dL
Specific Gravity, Urine: 1.025 (ref 1.005–1.030)

## 2011-03-24 ENCOUNTER — Other Ambulatory Visit (HOSPITAL_COMMUNITY): Payer: Self-pay | Admitting: Internal Medicine

## 2011-03-24 DIAGNOSIS — Z1231 Encounter for screening mammogram for malignant neoplasm of breast: Secondary | ICD-10-CM

## 2011-03-30 ENCOUNTER — Ambulatory Visit (HOSPITAL_COMMUNITY)
Admission: RE | Admit: 2011-03-30 | Discharge: 2011-03-30 | Disposition: A | Discharge status: Home / Self Care | Payer: PRIVATE HEALTH INSURANCE | Source: Ambulatory Visit | Attending: Internal Medicine | Admitting: Internal Medicine

## 2011-03-30 DIAGNOSIS — Z1231 Encounter for screening mammogram for malignant neoplasm of breast: Secondary | ICD-10-CM | POA: Insufficient documentation

## 2012-04-09 ENCOUNTER — Other Ambulatory Visit (HOSPITAL_COMMUNITY): Payer: Self-pay | Admitting: Family Medicine

## 2012-04-09 DIAGNOSIS — Z1231 Encounter for screening mammogram for malignant neoplasm of breast: Secondary | ICD-10-CM

## 2012-04-09 DIAGNOSIS — Z780 Asymptomatic menopausal state: Secondary | ICD-10-CM

## 2012-05-15 ENCOUNTER — Ambulatory Visit (HOSPITAL_COMMUNITY)
Admission: RE | Admit: 2012-05-15 | Discharge: 2012-05-15 | Disposition: A | Discharge status: Home / Self Care | Payer: PRIVATE HEALTH INSURANCE | Source: Ambulatory Visit | Attending: Family Medicine | Admitting: Family Medicine

## 2012-05-15 DIAGNOSIS — Z1231 Encounter for screening mammogram for malignant neoplasm of breast: Secondary | ICD-10-CM | POA: Insufficient documentation

## 2012-05-15 DIAGNOSIS — Z780 Asymptomatic menopausal state: Secondary | ICD-10-CM

## 2013-09-04 ENCOUNTER — Other Ambulatory Visit (HOSPITAL_COMMUNITY): Payer: Self-pay | Admitting: Family Medicine

## 2013-09-04 DIAGNOSIS — Z1231 Encounter for screening mammogram for malignant neoplasm of breast: Secondary | ICD-10-CM

## 2013-09-05 ENCOUNTER — Ambulatory Visit (HOSPITAL_COMMUNITY)
Admission: RE | Admit: 2013-09-05 | Discharge: 2013-09-05 | Disposition: A | Discharge status: Home / Self Care | Payer: BC Managed Care – PPO | Source: Ambulatory Visit | Attending: Family Medicine | Admitting: Family Medicine

## 2013-09-05 DIAGNOSIS — Z1231 Encounter for screening mammogram for malignant neoplasm of breast: Secondary | ICD-10-CM | POA: Insufficient documentation

## 2013-09-09 ENCOUNTER — Other Ambulatory Visit: Payer: Self-pay | Admitting: Family Medicine

## 2013-09-09 DIAGNOSIS — R928 Other abnormal and inconclusive findings on diagnostic imaging of breast: Secondary | ICD-10-CM

## 2013-09-17 ENCOUNTER — Ambulatory Visit
Admission: RE | Admit: 2013-09-17 | Discharge: 2013-09-17 | Disposition: A | Discharge status: Home / Self Care | Payer: BC Managed Care – PPO | Source: Ambulatory Visit | Attending: Family Medicine | Admitting: Family Medicine

## 2013-09-17 DIAGNOSIS — R928 Other abnormal and inconclusive findings on diagnostic imaging of breast: Secondary | ICD-10-CM

## 2014-11-26 ENCOUNTER — Encounter (HOSPITAL_COMMUNITY): Payer: Self-pay | Admitting: Emergency Medicine

## 2014-11-26 ENCOUNTER — Emergency Department (HOSPITAL_COMMUNITY)
Admission: EM | Admit: 2014-11-26 | Discharge: 2014-11-26 | Disposition: A | Discharge status: Home / Self Care | Payer: Self-pay | Attending: Emergency Medicine | Admitting: Emergency Medicine

## 2014-11-26 DIAGNOSIS — L720 Epidermal cyst: Secondary | ICD-10-CM | POA: Insufficient documentation

## 2014-11-26 DIAGNOSIS — L089 Local infection of the skin and subcutaneous tissue, unspecified: Secondary | ICD-10-CM

## 2014-11-26 DIAGNOSIS — Z79899 Other long term (current) drug therapy: Secondary | ICD-10-CM | POA: Insufficient documentation

## 2014-11-26 MED ORDER — LIDOCAINE-EPINEPHRINE 2 %-1:100000 IJ SOLN
INTRAMUSCULAR | Status: AC
  Administered 2014-11-26: 1 mL
  Filled 2014-11-26: qty 1

## 2014-11-26 MED ORDER — TRAMADOL HCL 50 MG PO TABS: 50.0000 mg | ORAL_TABLET | Freq: Four times a day (QID) | ORAL | Status: AC | PRN

## 2014-11-26 MED ORDER — LIDOCAINE-EPINEPHRINE (PF) 2 %-1:200000 IJ SOLN: 10.0000 mL | Freq: Once | INTRAMUSCULAR | Status: DC

## 2014-11-26 MED ORDER — HYDROCODONE-ACETAMINOPHEN 5-325 MG PO TABS
2.0000 | ORAL_TABLET | Freq: Once | ORAL | Status: AC
  Administered 2014-11-26: 2 via ORAL
  Filled 2014-11-26: qty 2

## 2014-11-26 MED ORDER — CEPHALEXIN 500 MG PO CAPS: 500.0000 mg | ORAL_CAPSULE | Freq: Four times a day (QID) | ORAL | Status: AC

## 2014-11-26 NOTE — ED Notes (Signed)
Pt states that she has had a cyst to rt side of neck x over a year.  States that earlier this week, pt states that she began having increased swelling to the area.  Raised, round abscess noted to rt side of neck with a large white head.  Appx 3 cm in diameter.

## 2014-11-26 NOTE — ED Provider Notes (Signed)
CSN: [id number]     Arrival date & time 11/26/14  1816 History  This chart was scribed for non-physician practitioner, Antony MaduraKelly Lanessa Shill, PA-C, working with Raeford RazorStephen Kohut, MD, by Roxy Cedarhandni Bhalodia ED Scribe. This patient was seen in room WTR8/WTR8 and the patient's care was started at 8:34 PM   Chief Complaint  Patient presents with  . Abscess   Patient is a 49 y.o. female presenting with abscess. The history is provided by the patient. No language interpreter was used.  Abscess Location:  Head/neck Head/neck abscess location:  R neck Abscess quality: painful   Duration:  4 days Progression:  Worsening Pain details:    Quality:  Aching and throbbing   Timing:  Constant   Progression:  Worsening Chronicity:  New Context: not diabetes   Relieved by:  Nothing Worsened by:  Nothing tried  HPI Comments: Casey SellChiquita A Wright is a 49 y.o. female who presents to the Emergency Department complaining of moderate abscess to right side of neck that initially began 1 year ago, with increased swelling onset a 4 days ago. She reports associated severe pain and described the pain to be aching and throbbing. She states that the abscess briefly drained last night, but has not drained since then. She states she tried applying hold and cold packs with no relief. She states that she took tylenol and ibuprofen with no relief. Patient denies hx of diabetes. Patient also reports hx of an abscess in her mouth for the past 4-6 months, and states that she has been seen for it.   History reviewed. No pertinent past medical history. History reviewed. No pertinent past surgical history. History reviewed. No pertinent family history. History  Substance Use Topics  . Smoking status: Never Smoker   . Smokeless tobacco: Not on file  . Alcohol Use: No   OB History    No data available     Review of Systems  Skin:       Abscess  All other systems reviewed and are negative.  Allergies  Review of patient's allergies  indicates no known allergies.  Home Medications   Prior to Admission medications   Medication Sig Start Date End Date Taking? Authorizing Provider  acetaminophen (TYLENOL) 500 MG tablet Take 500 mg by mouth every 6 (six) hours as needed for moderate pain or headache.   Yes Historical Provider, MD  Ascorbic Acid (VITAMIN C PO) Take 1 tablet by mouth daily.   Yes Historical Provider, MD  CALCIUM PO Take 1 tablet by mouth daily.   Yes Historical Provider, MD  Cholecalciferol (VITAMIN D PO) Take 1 tablet by mouth daily.   Yes Historical Provider, MD  diphenhydrAMINE (SOMINEX) 25 MG tablet Take 25 mg by mouth daily as needed for allergies or sleep.   Yes Historical Provider, MD  Omega-3 Fatty Acids (FISH OIL PO) Take 1 tablet by mouth daily.   Yes Historical Provider, MD  Tetrahydrozoline HCl (VISINE OP) Apply 1-2 drops to eye daily as needed (allergies).   Yes Historical Provider, MD  Thiamine Mononitrate (VITAMIN B1 PO) Take 1 tablet by mouth daily.   Yes Historical Provider, MD  VITAMIN E PO Take 1 tablet by mouth daily.   Yes Historical Provider, MD   Triage Vitals: BP 142/95 mmHg  Pulse 104  Temp(Src) 98.4 F (36.9 C) (Oral)  Resp 20  SpO2 100%  Physical Exam  Constitutional: She is oriented to person, place, and time. She appears well-developed and well-nourished. No distress.  Nontoxic/nonseptic appearing  HENT:  Head: Normocephalic and atraumatic.  Eyes: Conjunctivae and EOM are normal. No scleral icterus.  Neck: Normal range of motion.    No stridor. Epidermoid cyst with evidence of secondary infection and abscess to inferior portion.  Pulmonary/Chest: Effort normal. No respiratory distress.  Respirations even and unlabored  Musculoskeletal: Normal range of motion.  Neurological: She is alert and oriented to person, place, and time. She exhibits normal muscle tone. Coordination normal.  GCS 15. Patient moves extremities without ataxia.  Skin: Skin is warm and dry. No rash  noted. She is not diaphoretic. No erythema. No pallor.  Psychiatric: She has a normal mood and affect. Her behavior is normal.  Nursing note and vitals reviewed.   ED Course  Procedures (including critical care time)  DIAGNOSTIC STUDIES: Oxygen Saturation is 100% on RA, normal by my interpretation.    COORDINATION OF CARE: 8:38 PM- Discussed plans to inject lidocaine to affected area and drain abscess. Pt advised of plan for treatment and pt agrees.  Labs Review Labs Reviewed - No data to display  Imaging Review No results found.   EKG Interpretation None       INCISION AND DRAINAGE Performed by: Antony Madura Consent: Verbal consent obtained. Risks and benefits: risks, benefits and alternatives were discussed Type: abscess  Body area: R neck  Anesthesia: local infiltration  Incision was made with a scalpel.  Local anesthetic: lidocaine 2% with epinephrine  Anesthetic total: 4 ml  Complexity: complex Blunt dissection to break up loculations  Drainage: purulent  Drainage amount: moderate  Packing material: none  Patient tolerance: Patient tolerated the procedure well with no immediate complications.    MDM   Final diagnoses:  Infected epidermoid cyst    Patient with infected epidermoid cyst amenable to incision and drainage. Will refer to [facility] and dermatology regarding surgical removal of cyst given high probability or reinfection in the future. Encouraged home warm soaks and flushing. Mild signs of cellulitis is surrounding skin. Will place on Keflex given location of infected cyst. Return precautions discussed and provided. Patient agreeable to plan with no unaddressed concerns. Patient discharged in good condition.  I personally performed the services described in this documentation, which was scribed in my presence. The recorded information has been reviewed and is accurate.    Antony Madura, PA-C 11/26/14 2159  Raeford Razor, MD 11/30/14 [phone]

## 2014-11-26 NOTE — Discharge Instructions (Signed)
Epidermal Cyst An epidermal cyst is sometimes called a sebaceous cyst, epidermal inclusion cyst, or infundibular cyst. These cysts usually contain a substance that looks "pasty" or "cheesy" and may have a bad smell. This substance is a protein called keratin. Epidermal cysts are usually found on the face, neck, or trunk. They may also occur in the vaginal area or other parts of the genitalia of both men and women. Epidermal cysts are usually small, painless, slow-growing bumps or lumps that move freely under the skin. It is important not to try to pop them. This may cause an infection and lead to tenderness and swelling. CAUSES  Epidermal cysts may be caused by a deep penetrating injury to the skin or a plugged hair follicle, often associated with acne. SYMPTOMS  Epidermal cysts can become inflamed and cause:  Redness.  Tenderness.  Increased temperature of the skin over the bumps or lumps.  Grayish-white, bad smelling material that drains from the bump or lump. DIAGNOSIS  Epidermal cysts are easily diagnosed by your caregiver during an exam. Rarely, a tissue sample (biopsy) may be taken to rule out other conditions that may resemble epidermal cysts. TREATMENT   Epidermal cysts often get better and disappear on their own. They are rarely ever cancerous.  If a cyst becomes infected, it may become inflamed and tender. This may require opening and draining the cyst. Treatment with antibiotics may be necessary. When the infection is gone, the cyst may be removed with minor surgery.  Small, inflamed cysts can often be treated with antibiotics or by injecting steroid medicines.  Sometimes, epidermal cysts become large and bothersome. If this happens, surgical removal in your caregiver's office may be necessary. HOME CARE INSTRUCTIONS  Only take over-the-counter or prescription medicines as directed by your caregiver.  Take your antibiotics as directed. Finish them even if you start to feel  better. SEEK MEDICAL CARE IF:   Your cyst becomes tender, red, or swollen.  Your condition is not improving or is getting worse.  You have any other questions or concerns. MAKE SURE YOU:  Understand these instructions.  Will watch your condition.  Will get help right away if you are not doing well or get worse. Document Released: 07/08/2004 Document Revised: 10/30/2011 Document Reviewed: 02/13/2011 [facility] Patient Information 2015 [place], [place]. This information is not intended to replace advice given to you by your health care provider. Make sure you discuss any questions you have with your health care provider. Abscess Care After An abscess (also called a boil or furuncle) is an infected area that contains a collection of pus. Signs and symptoms of an abscess include pain, tenderness, redness, or hardness, or you may feel a moveable soft area under your skin. An abscess can occur anywhere in the body. The infection may spread to surrounding tissues causing cellulitis. A cut (incision) by the surgeon was made over your abscess and the pus was drained out. Gauze may have been packed into the space to provide a drain that will allow the cavity to heal from the inside outwards. The boil may be painful for 5 to 7 days. Most people with a boil do not have high fevers. Your abscess, if seen early, may not have localized, and may not have been lanced. If not, another appointment may be required for this if it does not get better on its own or with medications. HOME CARE INSTRUCTIONS   Only take over-the-counter or prescription medicines for pain, discomfort, or fever as directed by your  caregiver.  When you bathe, soak and then remove gauze or iodoform packs at least daily or as directed by your caregiver. You may then wash the wound gently with mild soapy water. Repack with gauze or do as your caregiver directs. SEEK IMMEDIATE MEDICAL CARE IF:   You develop increased pain, swelling, redness,  drainage, or bleeding in the wound site.  You develop signs of generalized infection including muscle aches, chills, fever, or a general ill feeling.  An oral temperature above 102 F (38.9 C) develops, not controlled by medication. See your caregiver for a recheck if you develop any of the symptoms described above. If medications (antibiotics) were prescribed, take them as directed. Document Released: 02/23/2005 Document Revised: 10/30/2011 Document Reviewed: 10/21/2007 [facility] Patient Information 2015 [place], [place]. This information is not intended to replace advice given to you by your health care provider. Make sure you discuss any questions you have with your health care provider.

## 2014-12-12 IMAGING — MG MM DIGITAL SCREENING BILAT W/ CAD
4 series · 4 of 4 positions shown · non-contrast
Comparison: Previous Exam(s)

CLINICAL DATA: Screening.

EXAM:
DIGITAL SCREENING BILATERAL MAMMOGRAM WITH CAD

[L MLO]
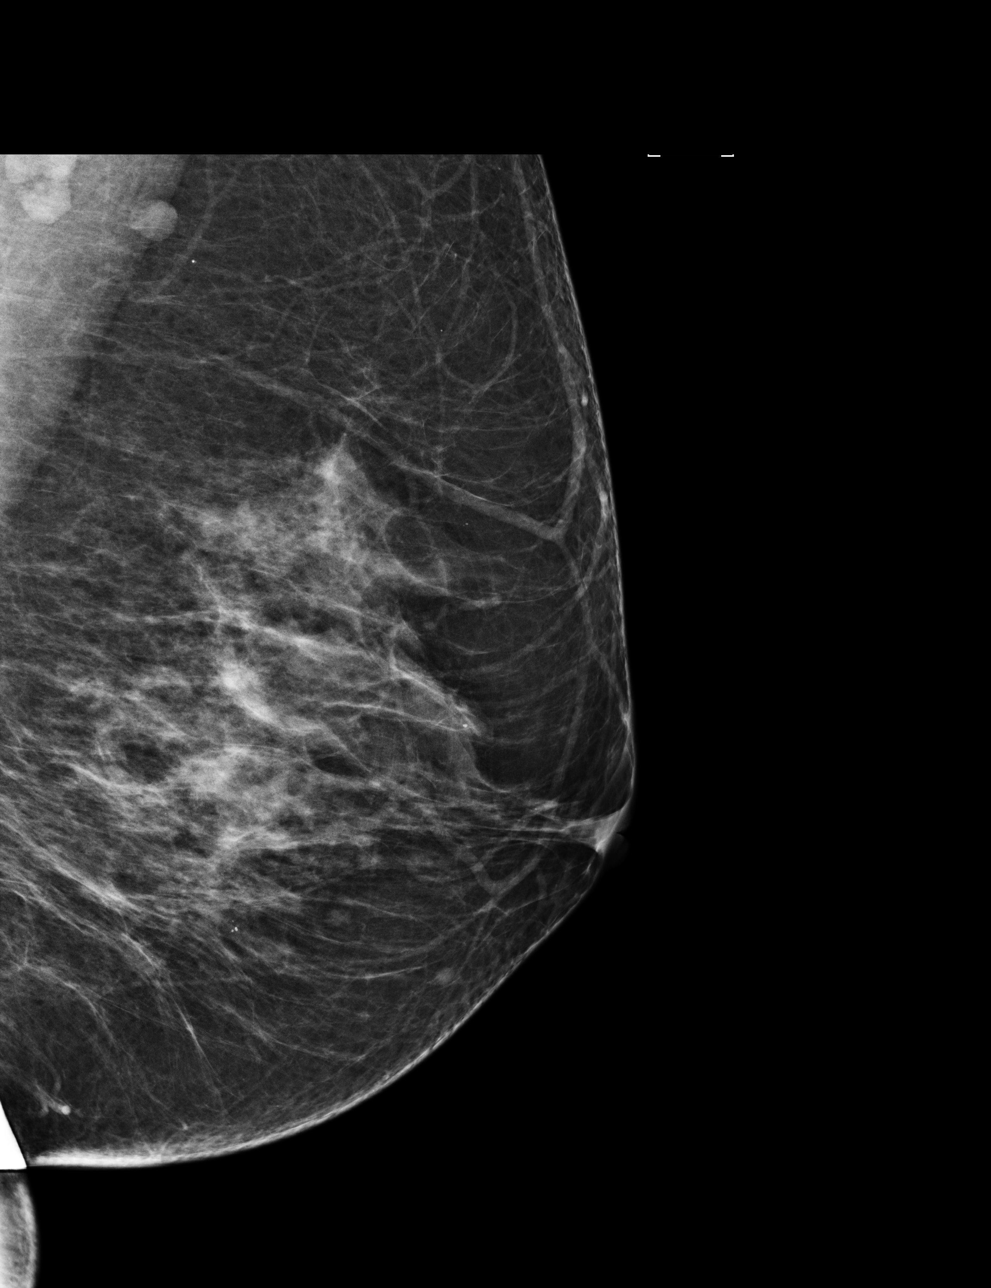

[L CC]
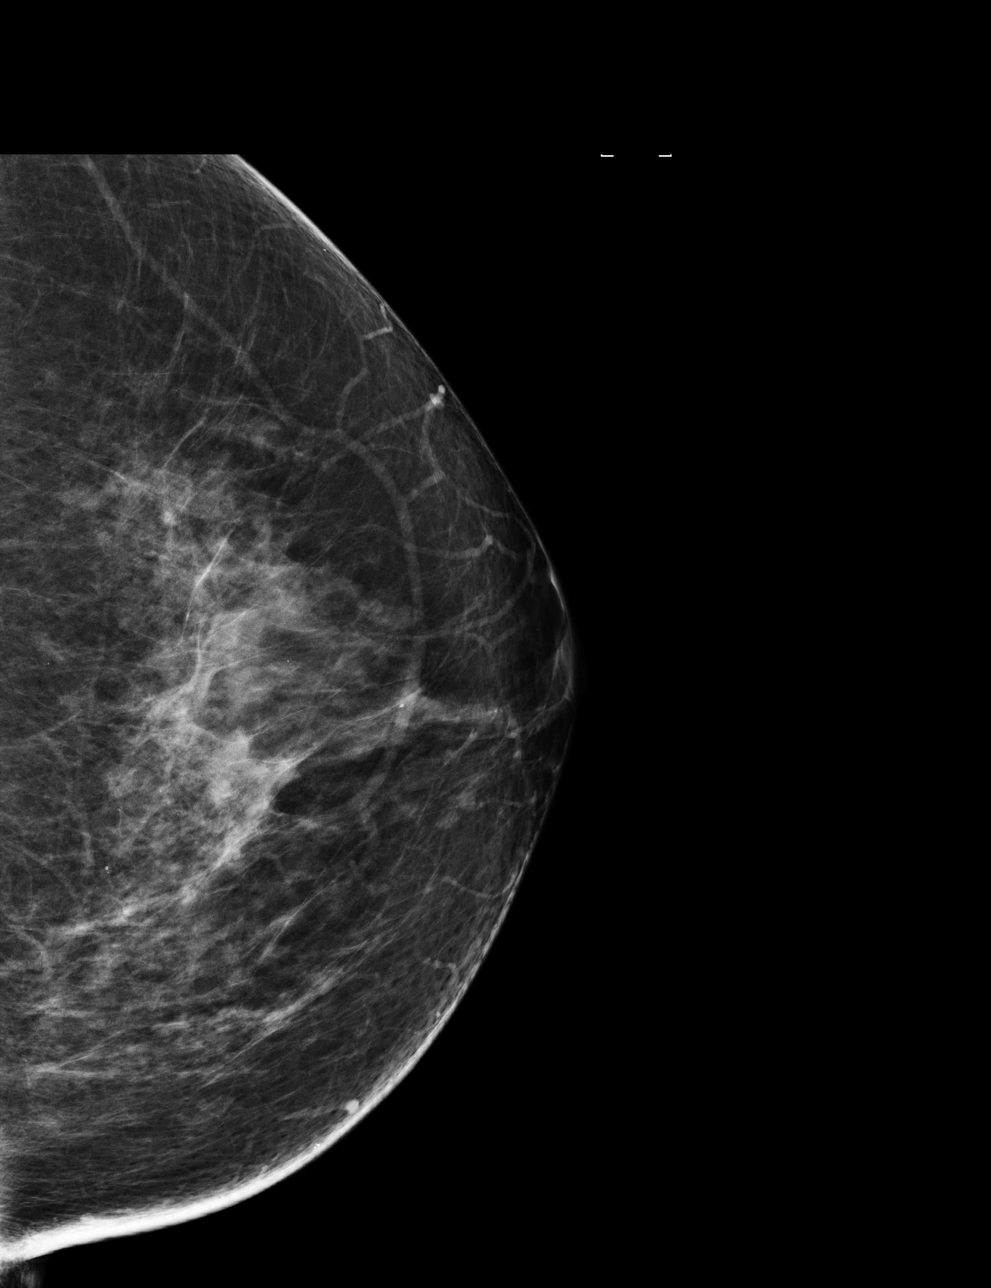

[R CC]
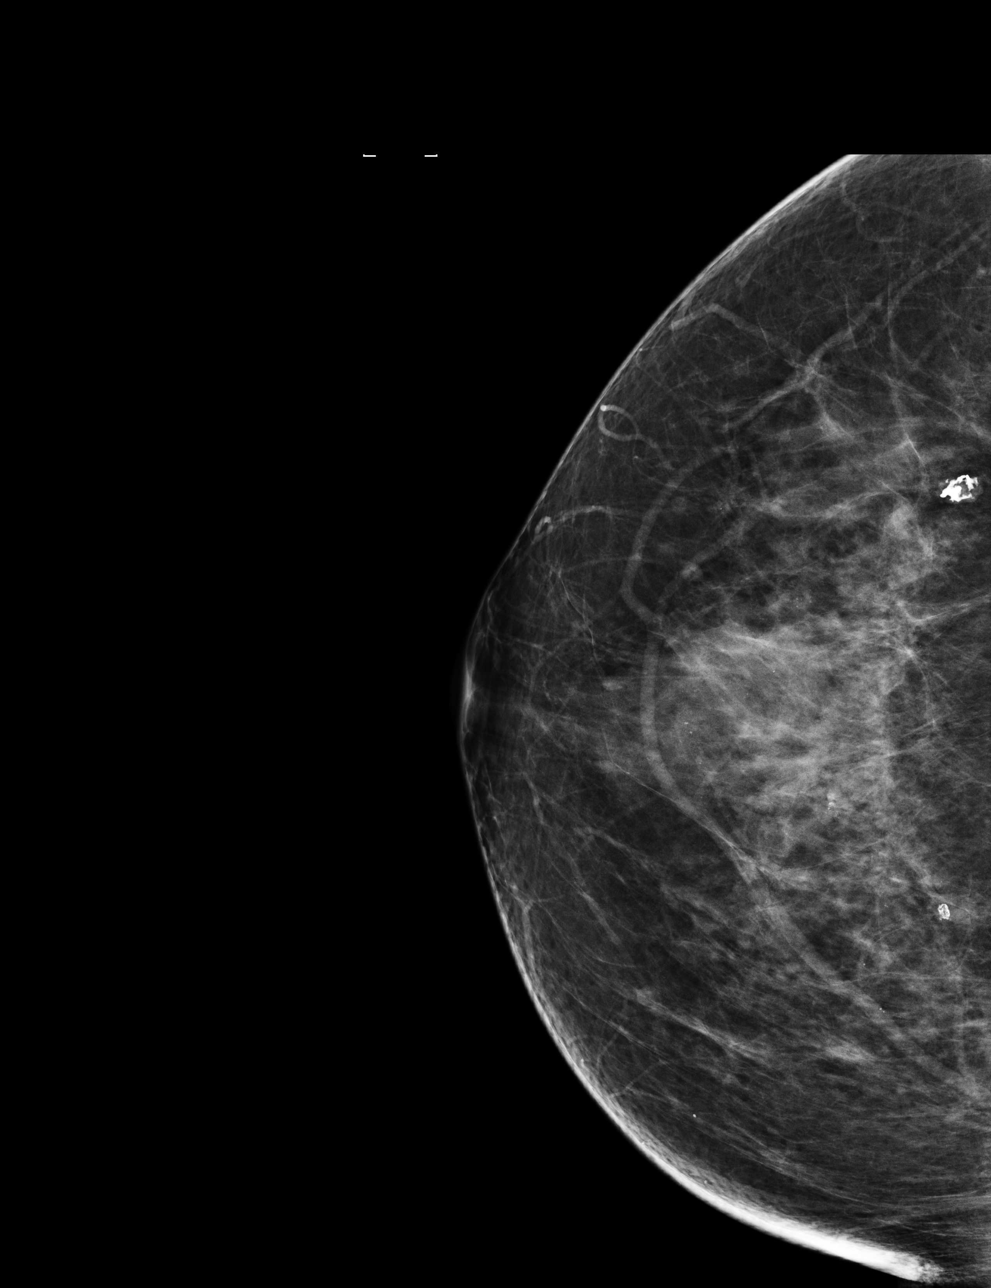

[R MLO]
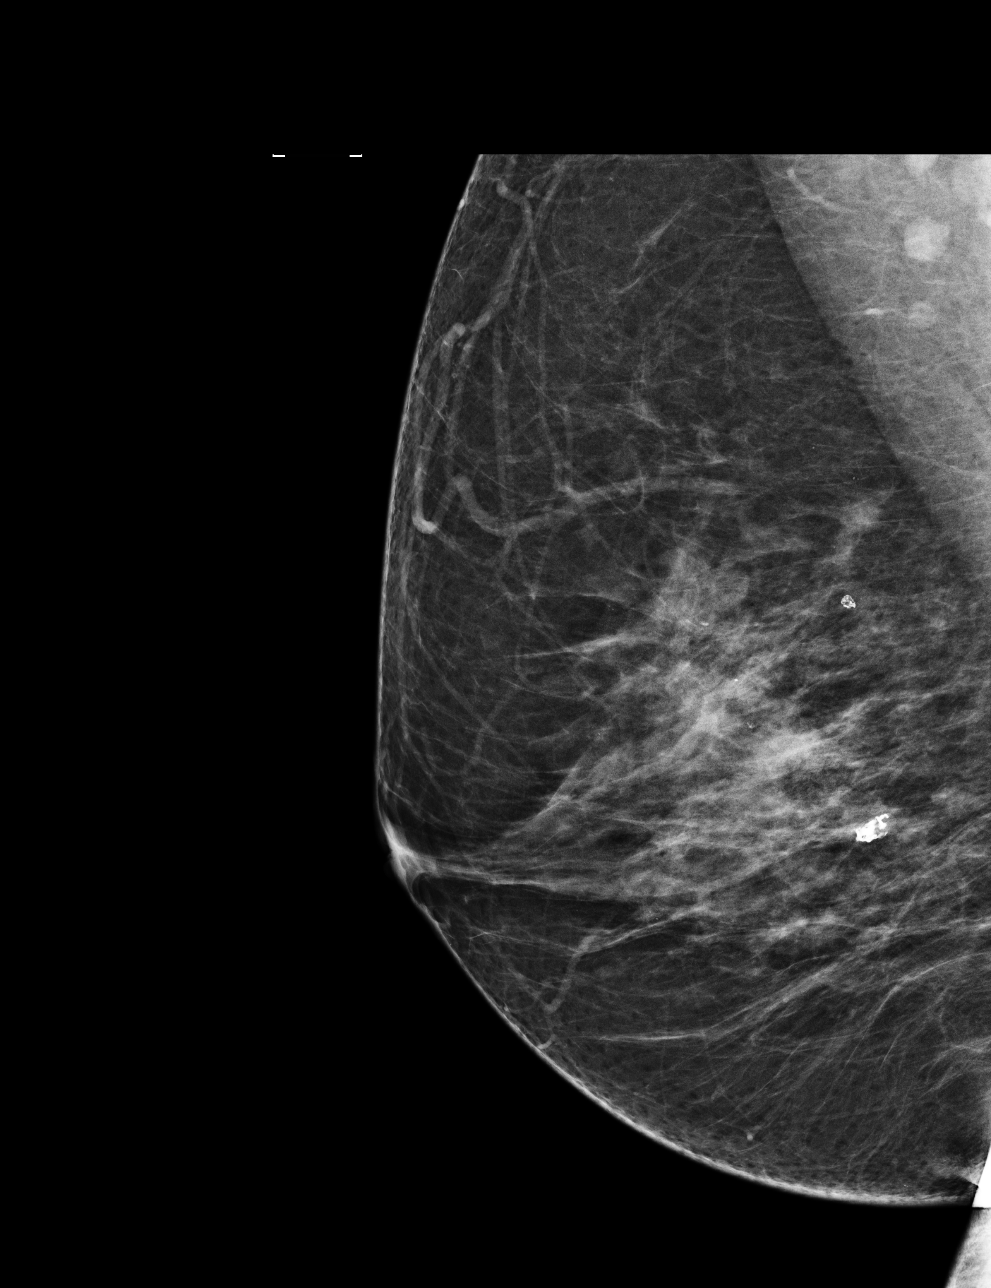

[4 of 4 positions shown; findings below may reference images not displayed]

ACR Breast Density Category c: The breast tissue is heterogeneously
dense, which may obscure small masses.
FINDINGS: In the right breast, a possible asymmetry warrants further
evaluation with spot compression views and possibly ultrasound. In
the left breast, no suspicious masses or malignant type
calcifications are identified. Images were processed with CAD.
IMPRESSION: Further evaluation is suggested for possible asymmetry in the right
breast.

RECOMMENDATION:
Diagnostic mammogram and possibly ultrasound of the right breast.
(Code:[id number]-[id number])

The patient will be contacted regarding the findings, and additional
imaging will be scheduled.

BI-RADS CATEGORY  0: Incomplete. Need additional imaging evaluation
and/or prior mammograms for comparison.

## 2014-12-24 IMAGING — MG MM DIGITAL DIAGNOSTIC UNILAT*R*
3 series · 3 of 3 positions shown · non-contrast
Comparison: Prior studies.

CLINICAL DATA: Recall from screening mammogram.

EXAM:
DIGITAL DIAGNOSTIC  right breast MAMMOGRAM WITH CAD

[R MLO]
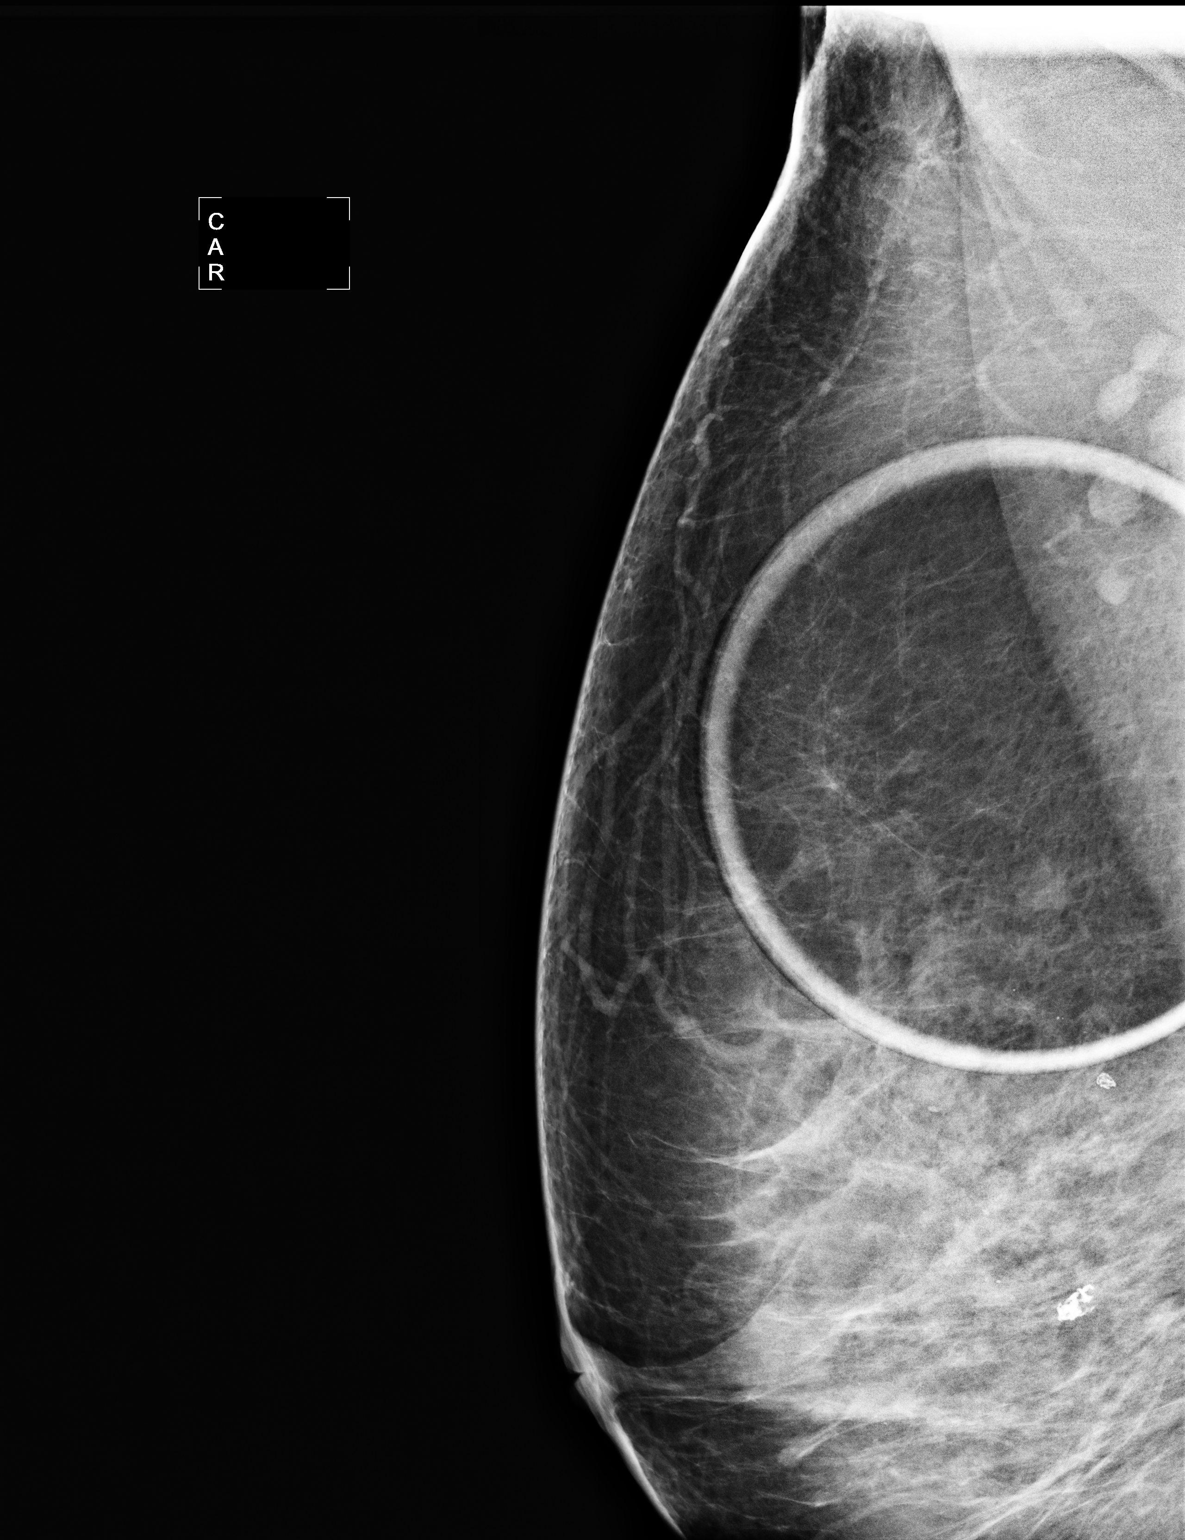

[R XCCL]
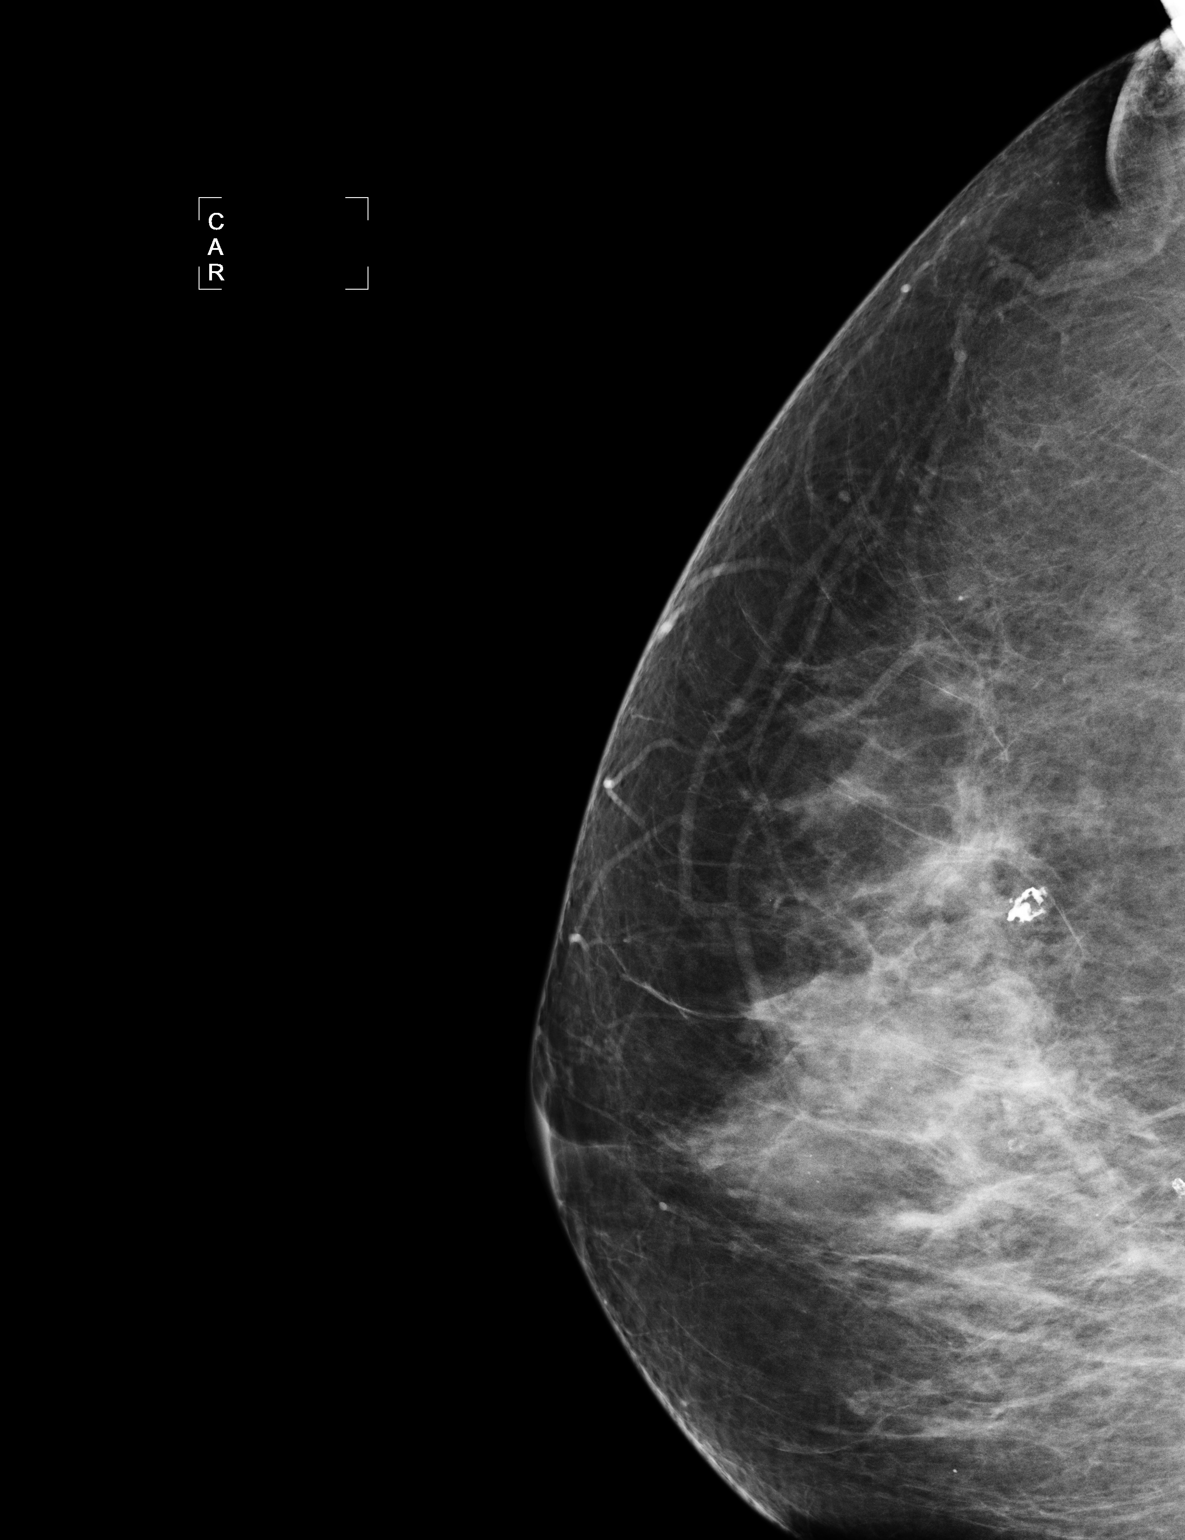

[R ML]
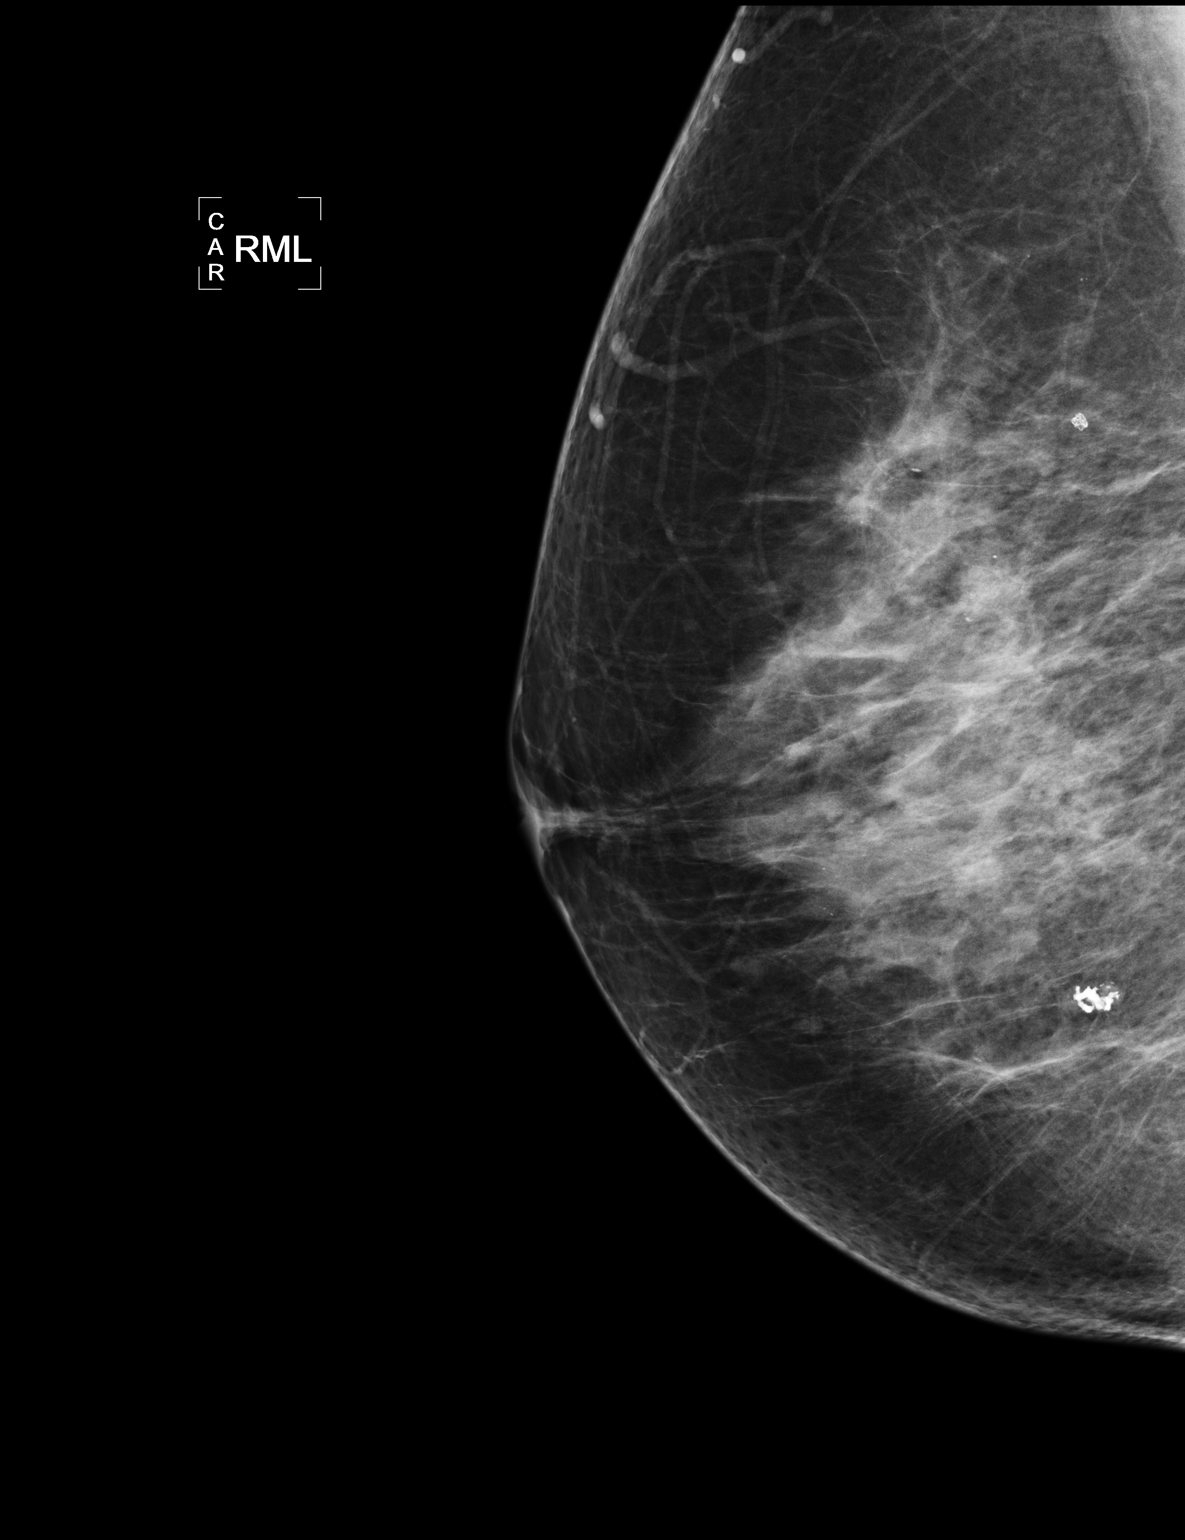

[3 of 3 positions shown; findings below may reference images not displayed]

ACR Breast Density Category b: There are scattered areas of
fibroglandular density.
FINDINGS: Additional views the right breast demonstrate no persistent
abnormality. The appearance is consistent with a summation shadow.

Mammographic images were processed with CAD.
IMPRESSION: No persistent abnormality on additional evaluation right breast.

RECOMMENDATION:
Screening mammography in 1 year.

I have discussed the findings and recommendations with the patient.
Results were also provided in writing at the conclusion of the
visit. If applicable, a reminder letter will be sent to the patient
regarding the next appointment.

BI-RADS CATEGORY  1: Negative.

## 2015-03-15 ENCOUNTER — Emergency Department (HOSPITAL_COMMUNITY)
Admission: EM | Admit: 2015-03-15 | Discharge: 2015-03-15 | Disposition: A | Discharge status: Home / Self Care | Payer: BLUE CROSS/BLUE SHIELD | Attending: Emergency Medicine | Admitting: Emergency Medicine

## 2015-03-15 ENCOUNTER — Encounter (HOSPITAL_COMMUNITY): Payer: Self-pay

## 2015-03-15 DIAGNOSIS — Z79899 Other long term (current) drug therapy: Secondary | ICD-10-CM | POA: Diagnosis not present

## 2015-03-15 DIAGNOSIS — L255 Unspecified contact dermatitis due to plants, except food: Secondary | ICD-10-CM

## 2015-03-15 DIAGNOSIS — R21 Rash and other nonspecific skin eruption: Secondary | ICD-10-CM | POA: Diagnosis present

## 2015-03-15 DIAGNOSIS — Z792 Long term (current) use of antibiotics: Secondary | ICD-10-CM | POA: Diagnosis not present

## 2015-03-15 MED ORDER — PREDNISONE 10 MG (21) PO TBPK: ORAL_TABLET | ORAL | Status: AC

## 2015-03-15 NOTE — Discharge Instructions (Signed)
Poison Ivy °Poison ivy is a inflammation of the skin (contact dermatitis) caused by touching the allergens on the leaves of the ivy plant following previous exposure to the plant. The rash usually appears 48 hours after exposure. The rash is usually bumps (papules) or blisters (vesicles) in a linear pattern. Depending on your own sensitivity, the rash may simply cause redness and itching, or it may also progress to blisters which may break open. These must be well cared for to prevent secondary bacterial (germ) infection, followed by scarring. Keep any open areas dry, clean, dressed, and covered with an antibacterial ointment if needed. The eyes may also get puffy. The puffiness is worst in the morning and gets better as the day progresses. This dermatitis usually heals without scarring, within 2 to 3 weeks without treatment. °HOME CARE INSTRUCTIONS  °Thoroughly wash with soap and water as soon as you have been exposed to poison ivy. You have about one half hour to remove the plant resin before it will cause the rash. This washing will destroy the oil or antigen on the skin that is causing, or will cause, the rash. Be sure to wash under your fingernails as any plant resin there will continue to spread the rash. Do not rub skin vigorously when washing affected area. Poison ivy cannot spread if no oil from the plant remains on your body. A rash that has progressed to weeping sores will not spread the rash unless you have not washed thoroughly. It is also important to wash any clothes you have been wearing as these may carry active allergens. The rash will return if you wear the unwashed clothing, even several days later. °Avoidance of the plant in the future is the best measure. Poison ivy plant can be recognized by the number of leaves. Generally, poison ivy has three leaves with flowering branches on a single stem. °Diphenhydramine may be purchased over the counter and used as needed for itching. Do not drive with  this medication if it makes you drowsy.Ask your caregiver about medication for children. °SEEK MEDICAL CARE IF: °· Open sores develop. °· Redness spreads beyond area of rash. °· You notice purulent (pus-like) discharge. °· You have increased pain. °· Other signs of infection develop (such as fever). °Document Released: 08/04/2000 Document Revised: 10/30/2011 Document Reviewed: 01/15/2009 °ExitCare® Patient Information ©2015 [employer], LLC. This information is not intended to replace advice given to you by your health care provider. Make sure you discuss any questions you have with your health care provider. ° °Poison Oak °Poison oak is an inflammation of the skin (contact dermatitis). It is caused by contact with the allergens on the leaves of the oak (toxicodendron) plants. Depending on your sensitivity, the rash may consist simply of redness and itching, or it may also progress to blisters which may break open (rupture). These must be well cared for to prevent secondary germ (bacterial) infection as these infections can lead to scarring. The eyes may also get puffy. The puffiness is worst in the morning and gets better as the day progresses. Healing is best accomplished by keeping any open areas dry, clean, covered with a bandage, and covered with an antibacterial ointment if needed. Without secondary infection, this dermatitis usually heals without scarring within 2 to 3 weeks without treatment. °HOME CARE INSTRUCTIONS °When you have been exposed to poison oak, it is very important to thoroughly wash with soap and water as soon as the exposure has been discovered. You have about one half hour to   remove the plant resin before it will cause the rash. This cleaning will quickly destroy the oil or antigen on the skin (the antigen is what causes the rash). Wash aggressively under the fingernails as any plant resin still there will continue to spread the rash. Do not rub skin vigorously when washing affected area.  Poison oak cannot spread if no oil from the plant remains on your body. Rash that has progressed to weeping sores (lesions) will not spread the rash unless you have not washed thoroughly. It is also important to clean any clothes you have been wearing as they may carry active allergens which will spread the rash, even several days later. °Avoidance of the plant in the future is the best measure. Poison oak plants can be recognized by the number of leaves. Generally, poison oak has three leaves with flowering branches on a single stem. °Diphenhydramine may be purchased over the counter and used as needed for itching. Do not drive with this medication if it makes you drowsy. Ask your caregiver about medication for children. °SEEK IMMEDIATE MEDICAL CARE IF:  °· Open areas of the rash develop. °· You notice redness extending beyond the area of the rash. °· There is a pus like discharge. °· There is increased pain. °· Other signs of infection develop (such as fever). °Document Released: 02/11/2003 Document Revised: 10/30/2011 Document Reviewed: 06/23/2009 °ExitCare® Patient Information ©2015 [employer]. This information is not intended to replace advice given to you by your health care provider. Make sure you discuss any questions you have with your health care provider. ° °

## 2015-03-15 NOTE — ED Notes (Signed)
MD at bedside. 

## 2015-03-15 NOTE — ED Provider Notes (Signed)
CSN: [id number]     Arrival date & time 03/15/15  4098 History   First MD Initiated Contact with Patient 03/15/15 [phone]     Chief Complaint  Patient presents with  . Rash    Patient is a 49 y.o. female presenting with rash. The history is provided by the patient.  Rash Location:  Full body Quality: blistering, itchiness and weeping   Severity:  Severe Onset quality:  Gradual Duration:  9 days Timing:  Constant Progression:  Worsening Context comment:  Pt was out working in the yard.  She has washed all of her sheets, clothes etc but she continues to see new areas of the rash. Relieved by:  Nothing Ineffective treatments:  Antihistamines and anti-itch cream Associated symptoms: no abdominal pain, no fever, no shortness of breath, no tongue swelling and not vomiting     History reviewed. No pertinent past medical history. Past Surgical History  Procedure Laterality Date  . Abdominal hysterectomy     History reviewed. No pertinent family history. History  Substance Use Topics  . Smoking status: Never Smoker   . Smokeless tobacco: Not on file  . Alcohol Use: No   OB History    No data available     Review of Systems  Constitutional: Negative for fever.  Respiratory: Negative for shortness of breath.   Gastrointestinal: Negative for vomiting and abdominal pain.  Skin: Positive for rash.  All other systems reviewed and are negative.     Allergies  Review of patient's allergies indicates no known allergies.  Home Medications   Prior to Admission medications   Medication Sig Start Date End Date Taking? Authorizing Provider  acetaminophen (TYLENOL) 500 MG tablet Take 500 mg by mouth every 6 (six) hours as needed for moderate pain or headache.    Historical Provider, MD  Ascorbic Acid (VITAMIN C PO) Take 1 tablet by mouth daily.    Historical Provider, MD  CALCIUM PO Take 1 tablet by mouth daily.    Historical Provider, MD  cephALEXin (KEFLEX) 500 MG capsule Take 1  capsule (500 mg total) by mouth 4 (four) times daily. 11/26/14   Antony Madura, PA-C  Cholecalciferol (VITAMIN D PO) Take 1 tablet by mouth daily.    Historical Provider, MD  diphenhydrAMINE (SOMINEX) 25 MG tablet Take 25 mg by mouth daily as needed for allergies or sleep.    Historical Provider, MD  Omega-3 Fatty Acids (FISH OIL PO) Take 1 tablet by mouth daily.    Historical Provider, MD  Tetrahydrozoline HCl (VISINE OP) Apply 1-2 drops to eye daily as needed (allergies).    Historical Provider, MD  Thiamine Mononitrate (VITAMIN B1 PO) Take 1 tablet by mouth daily.    Historical Provider, MD  traMADol (ULTRAM) 50 MG tablet Take 1 tablet (50 mg total) by mouth every 6 (six) hours as needed. 11/26/14   Antony Madura, PA-C  VITAMIN E PO Take 1 tablet by mouth daily.    Historical Provider, MD   BP 120/90 mmHg  Pulse 75  Temp(Src) 97.2 F (36.2 C) (Oral)  Resp 16  SpO2 97% Physical Exam  Constitutional: She appears well-developed and well-nourished. No distress.  HENT:  Head: Normocephalic and atraumatic.  Right Ear: External ear normal.  Left Ear: External ear normal.  Eyes: Conjunctivae are normal. Right eye exhibits no discharge. Left eye exhibits no discharge. No scleral icterus.  Neck: Neck supple. No tracheal deviation present.  Cardiovascular: Normal rate.   Pulmonary/Chest: Effort normal. No stridor. No  respiratory distress.  Musculoskeletal: She exhibits no edema.  Neurological: She is alert. Cranial nerve deficit: no gross deficits.  Skin: Skin is warm and dry. Rash noted. No burn and no petechiae noted. Rash is papular and vesicular.  Psychiatric: She has a normal mood and affect.  Nursing note and vitals reviewed.   ED Course  Procedures (including critical care time)   MDM   Final diagnoses:  Dermatitis due to plants, including poison ivy, sumac, and oak    Rash is consistent with poison ivy/oak.  Will rx prednisone taper.  Continue antihistamines and topical agents  prn    Linwood Dibbles, MD 03/15/15 [phone]

## 2015-03-15 NOTE — ED Notes (Signed)
Pt c/o increasing generalized rash x 9 days.  Pain score 9/10.  Pt believes she got into poison oak or ivy.  Sts rash is worse on BLE.  Pt has been using OTC medications w/o relief.

## 2015-05-28 ENCOUNTER — Emergency Department (HOSPITAL_COMMUNITY): Payer: BLUE CROSS/BLUE SHIELD

## 2015-05-28 ENCOUNTER — Encounter (HOSPITAL_COMMUNITY): Payer: Self-pay | Admitting: Emergency Medicine

## 2015-05-28 ENCOUNTER — Emergency Department (HOSPITAL_COMMUNITY)
Admission: EM | Admit: 2015-05-28 | Discharge: 2015-05-28 | Disposition: A | Discharge status: Home / Self Care | Payer: BLUE CROSS/BLUE SHIELD | Attending: Emergency Medicine | Admitting: Emergency Medicine

## 2015-05-28 DIAGNOSIS — M549 Dorsalgia, unspecified: Secondary | ICD-10-CM | POA: Insufficient documentation

## 2015-05-28 DIAGNOSIS — N23 Unspecified renal colic: Secondary | ICD-10-CM | POA: Insufficient documentation

## 2015-05-28 DIAGNOSIS — R109 Unspecified abdominal pain: Secondary | ICD-10-CM | POA: Diagnosis present

## 2015-05-28 LAB — I-STAT CG4 LACTIC ACID, ED: Lactic Acid, Venous: 4.36 mmol/L (ref 0.5–2.0)

## 2015-05-28 LAB — CBC
HCT: 37.9 % (ref 36.0–46.0)
Hemoglobin: 12.5 g/dL (ref 12.0–15.0)
MCH: 27.6 pg (ref 26.0–34.0)
MCHC: 33 g/dL (ref 30.0–36.0)
MCV: 83.7 fL (ref 78.0–100.0)
Platelets: 301 10*3/uL (ref 150–400)
RBC: 4.53 MIL/uL (ref 3.87–5.11)
RDW: 13.2 % (ref 11.5–15.5)
WBC: 11.1 10*3/uL — ABNORMAL HIGH (ref 4.0–10.5)

## 2015-05-28 LAB — URINALYSIS, ROUTINE W REFLEX MICROSCOPIC
Bilirubin Urine: NEGATIVE
Glucose, UA: NEGATIVE mg/dL
Hgb urine dipstick: NEGATIVE
Ketones, ur: 15 mg/dL — AB
Nitrite: NEGATIVE
Protein, ur: NEGATIVE mg/dL
Specific Gravity, Urine: 1.025 (ref 1.005–1.030)
Urobilinogen, UA: 0.2 mg/dL (ref 0.0–1.0)
pH: 8.5 — ABNORMAL HIGH (ref 5.0–8.0)

## 2015-05-28 LAB — COMPREHENSIVE METABOLIC PANEL
ALT: 15 U/L (ref 14–54)
AST: 29 U/L (ref 15–41)
Albumin: 4 g/dL (ref 3.5–5.0)
Alkaline Phosphatase: 63 U/L (ref 38–126)
Anion gap: 14 (ref 5–15)
BUN: 13 mg/dL (ref 6–20)
CO2: 24 mmol/L (ref 22–32)
Calcium: 9.5 mg/dL (ref 8.9–10.3)
Chloride: 103 mmol/L (ref 101–111)
Creatinine, Ser: 1.25 mg/dL — ABNORMAL HIGH (ref 0.44–1.00)
GFR calc Af Amer: 58 mL/min — ABNORMAL LOW (ref >60)
GFR calc non Af Amer: 50 mL/min — ABNORMAL LOW (ref >60)
Glucose, Bld: 152 mg/dL — ABNORMAL HIGH (ref 65–99)
Potassium: 3.2 mmol/L — ABNORMAL LOW (ref 3.5–5.1)
Sodium: 141 mmol/L (ref 135–145)
Total Bilirubin: 0.4 mg/dL (ref 0.3–1.2)
Total Protein: 7.3 g/dL (ref 6.5–8.1)

## 2015-05-28 LAB — URINE MICROSCOPIC-ADD ON

## 2015-05-28 LAB — LIPASE, BLOOD: Lipase: 26 U/L (ref 22–51)

## 2015-05-28 MED ORDER — OXYCODONE-ACETAMINOPHEN 5-325 MG PO TABS: 1.0000 | ORAL_TABLET | Freq: Four times a day (QID) | ORAL | Status: AC | PRN

## 2015-05-28 MED ORDER — IOHEXOL 300 MG/ML  SOLN
80.0000 mL | Freq: Once | INTRAMUSCULAR | Status: AC | PRN
  Administered 2015-05-28: 80 mL via INTRAVENOUS

## 2015-05-28 MED ORDER — LIDOCAINE IN D5W 4-5 MG/ML-% IV SOLN
1.5000 mg/kg | Freq: Once | INTRAVENOUS | Status: AC
  Administered 2015-05-28: 98.72 mg via INTRAVENOUS
  Filled 2015-05-28: qty 250

## 2015-05-28 MED ORDER — ONDANSETRON 4 MG PO TBDP
4.0000 mg | ORAL_TABLET | Freq: Once | ORAL | Status: AC | PRN
  Administered 2015-05-28: 4 mg via ORAL
  Filled 2015-05-28: qty 1

## 2015-05-28 MED ORDER — HYDROMORPHONE HCL 1 MG/ML IJ SOLN
1.0000 mg | Freq: Once | INTRAMUSCULAR | Status: AC
  Administered 2015-05-28: 1 mg via INTRAVENOUS
  Filled 2015-05-28: qty 1

## 2015-05-28 MED ORDER — ONDANSETRON HCL 4 MG PO TABS: 4.0000 mg | ORAL_TABLET | Freq: Four times a day (QID) | ORAL | Status: AC

## 2015-05-28 MED ORDER — FENTANYL CITRATE (PF) 100 MCG/2ML IJ SOLN
50.0000 ug | Freq: Once | INTRAMUSCULAR | Status: AC
  Administered 2015-05-28: 50 ug via INTRAVENOUS
  Filled 2015-05-28: qty 2

## 2015-05-28 MED ORDER — KETOROLAC TROMETHAMINE 30 MG/ML IJ SOLN
15.0000 mg | Freq: Once | INTRAMUSCULAR | Status: AC
  Administered 2015-05-28: 15 mg via INTRAVENOUS
  Filled 2015-05-28: qty 1

## 2015-05-28 MED ORDER — SODIUM CHLORIDE 0.9 % IV BOLUS (SEPSIS)
1000.0000 mL | Freq: Once | INTRAVENOUS | Status: AC
  Administered 2015-05-28: 1000 mL via INTRAVENOUS

## 2015-05-28 MED ORDER — TAMSULOSIN HCL 0.4 MG PO CAPS: 0.4000 mg | ORAL_CAPSULE | Freq: Every day | ORAL | Status: AC

## 2015-05-28 NOTE — ED Notes (Signed)
Urine strainer given to pt.

## 2015-05-28 NOTE — ED Notes (Signed)
Pt reports generalized abd pain that radiates to the back with n/v; denies diarrhea; pt reports pain started at 2am but "fell back asleep";

## 2015-05-28 NOTE — Discharge Instructions (Signed)
Return to the ED with any concerns including fever/chills, vomiting and not able to keep down liquids, pain not controlled by pain medications, decreased level of alertness/lethargy, or any other alarming symptoms °

## 2015-05-28 NOTE — ED Provider Notes (Addendum)
CSN: [id number]     Arrival date & time 05/28/15  0508 History   First MD Initiated Contact with Patient 05/28/15 0530     Chief Complaint  Patient presents with  . Abdominal Pain     (Consider location/radiation/quality/duration/timing/severity/associated sxs/prior Treatment) Patient is a 49 y.o. female presenting with abdominal pain. The history is provided by the patient.  Abdominal Pain Pain location:  Generalized Pain quality: sharp and shooting   Pain radiates to:  Groin and back Pain severity:  Severe Onset quality:  Sudden Duration:  3 hours Timing:  Constant Progression:  Worsening Chronicity:  New Context: recent sexual activity   Context: not trauma   Ineffective treatments:  None tried Associated symptoms: nausea     History reviewed. No pertinent past medical history. Past Surgical History  Procedure Laterality Date  . Abdominal hysterectomy     History reviewed. No pertinent family history. Social History  Substance Use Topics  . Smoking status: Never Smoker   . Smokeless tobacco: None  . Alcohol Use: No   OB History    No data available     Review of Systems  Gastrointestinal: Positive for nausea and abdominal pain.  Musculoskeletal: Positive for back pain.  All other systems reviewed and are negative.     Allergies  Nyquil multi-symptom  Home Medications   Prior to Admission medications   Medication Sig Start Date End Date Taking? Authorizing Provider  cephALEXin (KEFLEX) 500 MG capsule Take 1 capsule (500 mg total) by mouth 4 (four) times daily. Patient not taking: Reported on 03/15/2015 11/26/14   Antony Madura, PA-C  predniSONE (STERAPRED UNI-PAK 21 TAB) 10 MG (21) TBPK tablet Take 6 tabs by mouth daily  for 2 days, then 5 tabs for 2 days, then 4 tabs for 2 days, then 3 tabs for 2 days, 2 tabs for 2 days, then 1 tab by mouth daily for 2 days Patient not taking: Reported on 05/28/2015 03/15/15   Linwood Dibbles, MD  traMADol (ULTRAM) 50 MG tablet  Take 1 tablet (50 mg total) by mouth every 6 (six) hours as needed. Patient not taking: Reported on 03/15/2015 11/26/14   Antony Madura, PA-C   BP 140/77 mmHg  Pulse 72  Temp(Src) 97.7 F (36.5 C) (Oral)  Resp 18  Wt 145 lb (65.772 kg)  SpO2 96% Physical Exam  Constitutional: She is oriented to person, place, and time. She appears well-developed.  HENT:  Head: Normocephalic and atraumatic.  Eyes: Conjunctivae and EOM are normal. Pupils are equal, round, and reactive to light.  Neck: Normal range of motion. Neck supple.  Cardiovascular: Normal rate, regular rhythm and normal heart sounds.   Pulmonary/Chest: Effort normal and breath sounds normal. No respiratory distress.  Abdominal: Soft. Bowel sounds are normal. She exhibits no distension. There is tenderness. There is no rebound and no guarding.  Diffuse tenderness, worst on the right side.  Neurological: She is alert and oriented to person, place, and time.  Skin: Skin is warm and dry.  Nursing note and vitals reviewed.   ED Course  Procedures (including critical care time) Labs Review Labs Reviewed  COMPREHENSIVE METABOLIC PANEL - Abnormal; Notable for the following:    Potassium 3.2 (*)    Glucose, Bld 152 (*)    Creatinine, Ser 1.25 (*)    GFR calc non Af Amer 50 (*)    GFR calc Af Amer 58 (*)    All other components within normal limits  CBC - Abnormal; Notable for  the following:    WBC 11.1 (*)    All other components within normal limits  URINALYSIS, ROUTINE W REFLEX MICROSCOPIC (NOT AT [facility]) - Abnormal; Notable for the following:    pH 8.5 (*)    Ketones, ur 15 (*)    Leukocytes, UA SMALL (*)    All other components within normal limits  I-STAT CG4 LACTIC ACID, ED - Abnormal; Notable for the following:    Lactic Acid, Venous 4.36 (*)    All other components within normal limits  LIPASE, BLOOD  URINE MICROSCOPIC-ADD ON    Imaging Review Ct Abdomen Pelvis W Contrast  05/28/2015   CLINICAL DATA:  Severe  generalized abdominal and LEFT groin pain.  EXAM: CT ABDOMEN AND PELVIS WITH CONTRAST  TECHNIQUE: Multidetector CT imaging of the abdomen and pelvis was performed using the standard protocol following bolus administration of intravenous contrast.  CONTRAST:  80mL OMNIPAQUE IOHEXOL 300 MG/ML  SOLN  COMPARISON:  None.  FINDINGS: LUNG BASES: Included view of the lung bases are clear. Visualized heart and pericardium are unremarkable. Mildly patulous air distended esophagus can be seen with reflux.  SOLID ORGANS: The liver demonstrates focal fatty infiltration about the falciform ligament, otherwise unremarkable. Spleen, gallbladder, pancreas and adrenal glands are unremarkable.  GASTROINTESTINAL TRACT: The stomach, small and large bowel are normal in course and caliber without inflammatory changes. Normal appendix.  KIDNEYS/ URINARY TRACT: Kidneys are orthotopic, delayed RIGHT nephrogram. No nephrolithiasis, or solid renal masses. Too small to characterize hypodensity upper pole LEFT kidney. Mild RIGHT hydroureteronephrosis the level of the distal ureter where a 2 mm calculus is present. Normal contrast enhancement delayed images of LEFT kidney, delayed renal function on the RIGHT without excretion of contrast. Urinary bladder is partially distended and unremarkable.  PERITONEUM/RETROPERITONEUM: Aortoiliac vessels are normal in course and caliber. No lymphadenopathy by CT size criteria. Status post hysterectomy. No intraperitoneal free fluid nor free air.  SOFT TISSUE/OSSEOUS STRUCTURES: Non-suspicious. Small fat containing umbilical hernia. Anterior abdominal wall scarring. 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  IMPRESSION: 2 mm distal RIGHT ureteral calculus results in mild obstructive uropathy and mild delayed function.   Electronically Signed   By: Awilda Metro M.D.   On: 05/28/2015 06:47   I have personally reviewed and evaluated these images and lab results as part of my medical  decision-making.   EKG Interpretation None      MDM   Final diagnoses:  Ureteral colic    Pt comes in with abd pain. Sudden onset. She had intercourse at 11 pm. She denies any trauma, but she does state that she had some post coital discomfort. No hx of renal stones. She is s/p TAH BSO. Pt has no significant medical hx, no hx of drug use.  Ddx: Renal stones, internal bleeding from rigorous intercourse, thrombosis leading to infarct of an organ, perforation.  CT with iv contrast ordered.   7:30am  Pt's pain is down to 7/10 with dilaudid. Still in significant discomfort. Will try toradol. CT shows 2 mm R sided ureteral stone.  8:30 am Pt is still having pain. She is not appearing in distress. We will try lidocaine. Dr. Karma Ganja to f/u.   Derwood Kaplan, MD 05/28/15 2952  Derwood Kaplan, MD 05/28/15 0830

## 2015-07-02 ENCOUNTER — Other Ambulatory Visit: Payer: Self-pay

## 2015-07-02 DIAGNOSIS — Z1231 Encounter for screening mammogram for malignant neoplasm of breast: Secondary | ICD-10-CM

## 2015-07-28 ENCOUNTER — Ambulatory Visit
Admission: RE | Admit: 2015-07-28 | Discharge: 2015-07-28 | Disposition: A | Discharge status: Home / Self Care | Payer: BLUE CROSS/BLUE SHIELD | Source: Ambulatory Visit

## 2015-07-28 DIAGNOSIS — Z1231 Encounter for screening mammogram for malignant neoplasm of breast: Secondary | ICD-10-CM
